# Patient Record
Sex: Male | Born: 1935 | Race: White | Hispanic: No
Health system: Southern US, Community
[De-identification: ages and names within clinical notes are randomized; demographics above are authoritative.]

---

## 1997-12-11 ENCOUNTER — Inpatient Hospital Stay (HOSPITAL_COMMUNITY): Admission: AD | Admit: 1997-12-11 | Discharge: 1997-12-13 | Payer: Self-pay | Admitting: Cardiology

## 1999-03-04 ENCOUNTER — Ambulatory Visit (HOSPITAL_COMMUNITY): Admission: RE | Admit: 1999-03-04 | Discharge: 1999-03-04 | Payer: Self-pay | Admitting: Cardiology

## 1999-10-12 ENCOUNTER — Encounter: Admission: RE | Admit: 1999-10-12 | Discharge: 2000-01-10 | Payer: Self-pay | Admitting: Internal Medicine

## 2000-01-10 ENCOUNTER — Encounter: Admission: RE | Admit: 2000-01-10 | Discharge: 2000-04-09 | Payer: Self-pay | Admitting: Orthopedic Surgery

## 2000-05-02 ENCOUNTER — Encounter: Admission: RE | Admit: 2000-05-02 | Discharge: 2000-07-31 | Payer: Self-pay | Admitting: Orthopedic Surgery

## 2000-08-22 ENCOUNTER — Encounter: Admission: RE | Admit: 2000-08-22 | Discharge: 2000-08-28 | Payer: Self-pay | Admitting: Orthopedic Surgery

## 2000-11-28 ENCOUNTER — Encounter: Admission: RE | Admit: 2000-11-28 | Discharge: 2000-12-04 | Payer: Self-pay | Admitting: Orthopedic Surgery

## 2002-08-30 ENCOUNTER — Encounter: Payer: Self-pay | Admitting: Cardiology

## 2002-08-30 ENCOUNTER — Inpatient Hospital Stay (HOSPITAL_COMMUNITY): Admission: AD | Admit: 2002-08-30 | Discharge: 2002-09-05 | Payer: Self-pay | Admitting: Cardiology

## 2002-09-01 ENCOUNTER — Encounter: Payer: Self-pay | Admitting: Cardiology

## 2002-09-03 ENCOUNTER — Encounter: Payer: Self-pay | Admitting: Cardiology

## 2005-04-19 ENCOUNTER — Emergency Department (HOSPITAL_COMMUNITY): Admission: EM | Admit: 2005-04-19 | Discharge: 2005-04-19 | Payer: Self-pay | Admitting: Emergency Medicine

## 2006-01-24 ENCOUNTER — Inpatient Hospital Stay (HOSPITAL_COMMUNITY): Admission: EM | Admit: 2006-01-24 | Discharge: 2006-02-03 | Payer: Self-pay | Admitting: Emergency Medicine

## 2007-02-26 ENCOUNTER — Encounter (HOSPITAL_BASED_OUTPATIENT_CLINIC_OR_DEPARTMENT_OTHER): Admission: RE | Admit: 2007-02-26 | Discharge: 2007-05-27 | Payer: Self-pay | Admitting: Surgery

## 2007-03-05 ENCOUNTER — Ambulatory Visit: Payer: Self-pay | Admitting: Surgery

## 2007-03-21 ENCOUNTER — Ambulatory Visit: Payer: Self-pay | Admitting: Vascular Surgery

## 2007-03-22 ENCOUNTER — Observation Stay (HOSPITAL_COMMUNITY): Admission: RE | Admit: 2007-03-22 | Discharge: 2007-03-22 | Payer: Self-pay | Admitting: Surgery

## 2007-03-22 ENCOUNTER — Ambulatory Visit: Payer: Self-pay | Admitting: Surgery

## 2007-04-18 ENCOUNTER — Inpatient Hospital Stay (HOSPITAL_COMMUNITY): Admission: EM | Admit: 2007-04-18 | Discharge: 2007-04-30 | Payer: Self-pay | Admitting: Emergency Medicine

## 2007-05-16 ENCOUNTER — Ambulatory Visit: Payer: Self-pay | Admitting: Vascular Surgery

## 2008-10-02 IMAGING — CT CT HEAD W/O CM
1 series · 16 of 30 positions shown, 20 images · IV contrast (agent unspecified)
Comparison: 02/02/07.

CLINICAL DATA: Diabetes and toe necrosis. 
HEAD CT WITHOUT CONTRAST:
TECHNIQUE: Contiguous axial images were obtained from the base of the skull through the vertex according to standard protocol without contrast.

[Series 2: head_seq 4.5 h37s st · axial · 0.43mm/px · z∈[+1241,+1389]mm · 16 of 36 slices shown, 20 images]
[im 2/36  brain]
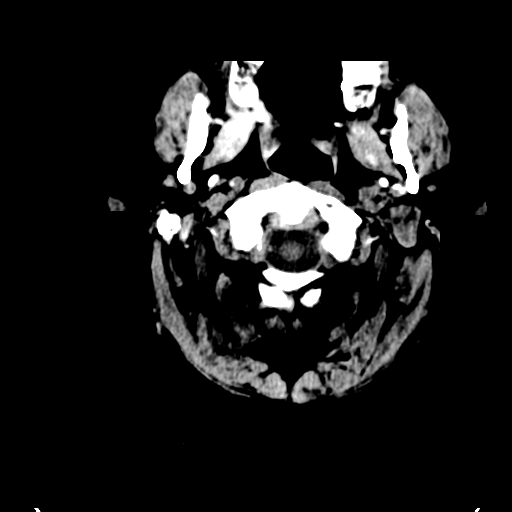
[im 2/36  bone]
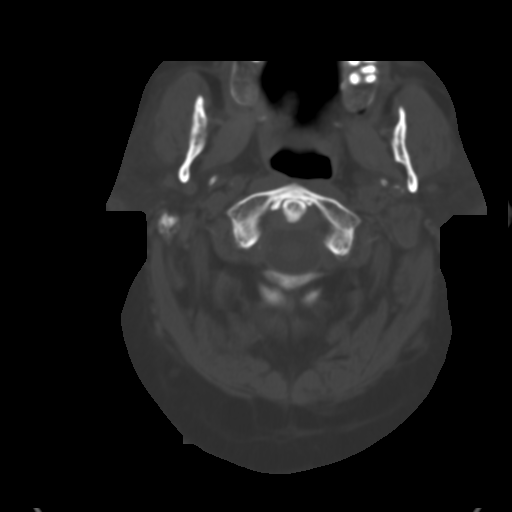
[im 4/36  brain]
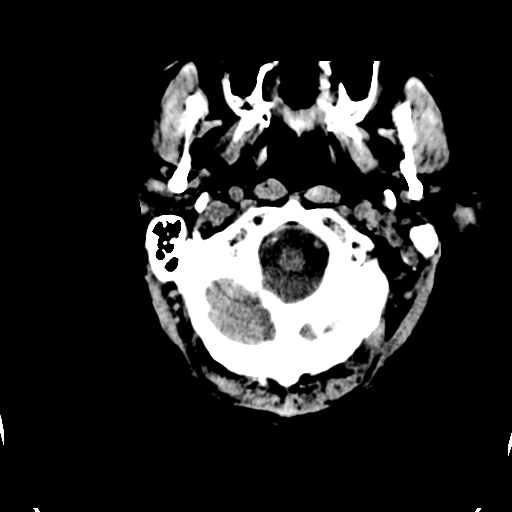
[im 7/36  brain]
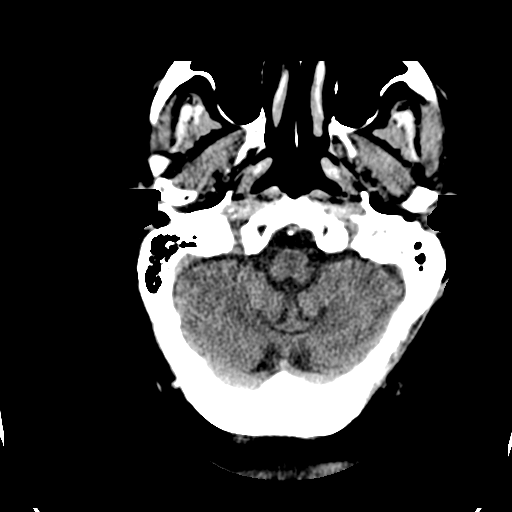
[im 9/36  brain]
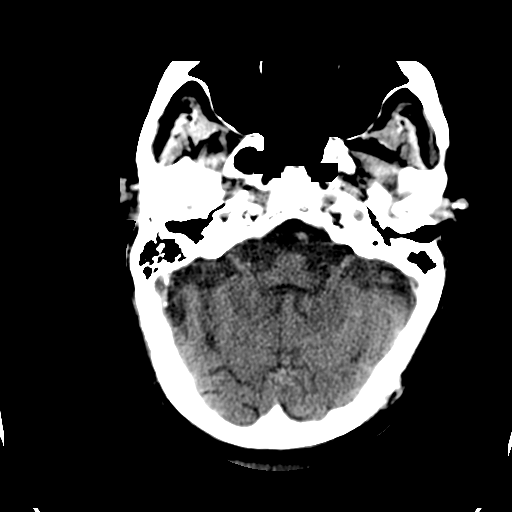
[im 10/36  brain]
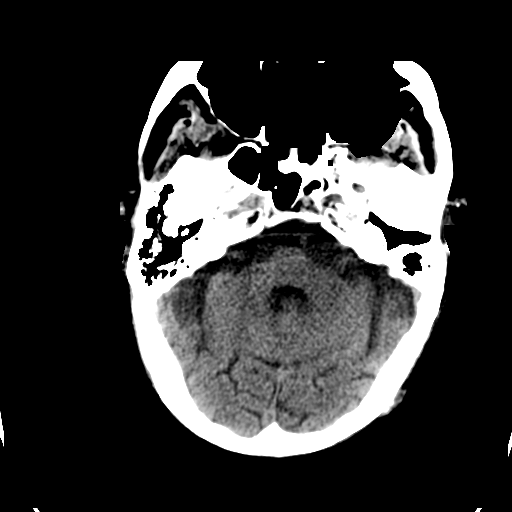
[im 10/36  bone]
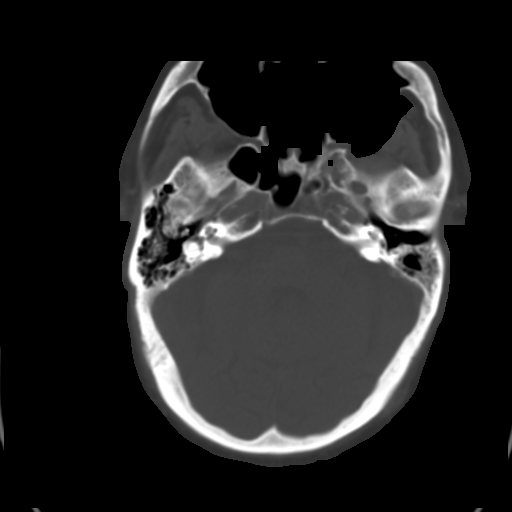
[im 13/36  brain]
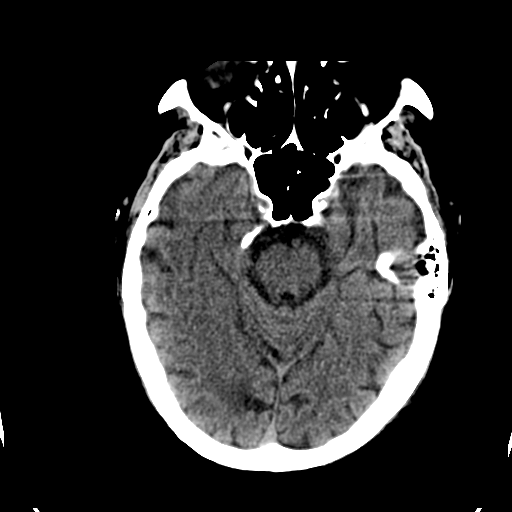
[im 15/36  brain]
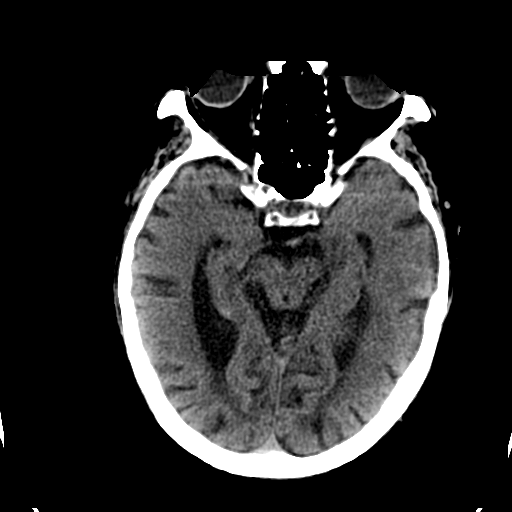
[im 17/36  brain]
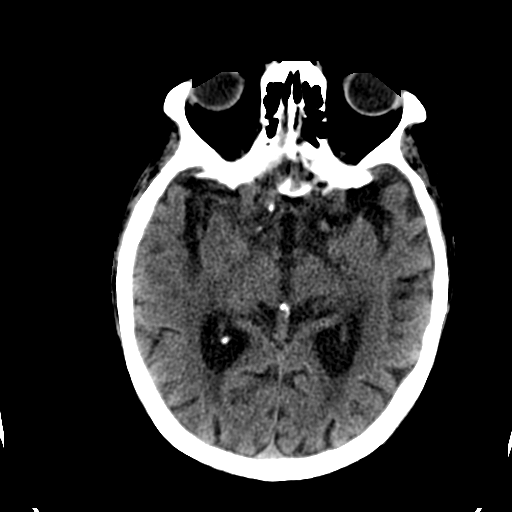
[im 19/36  brain]
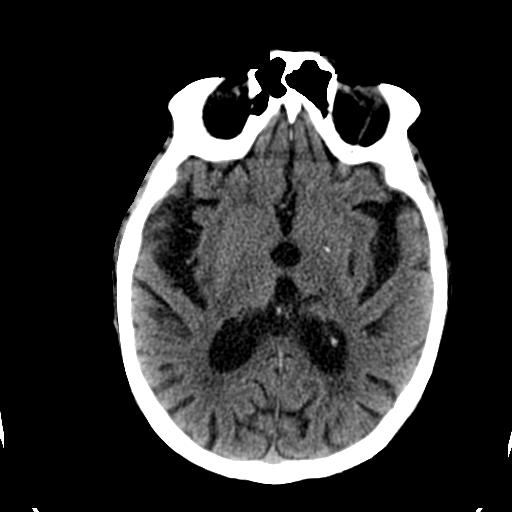
[im 19/36  bone]
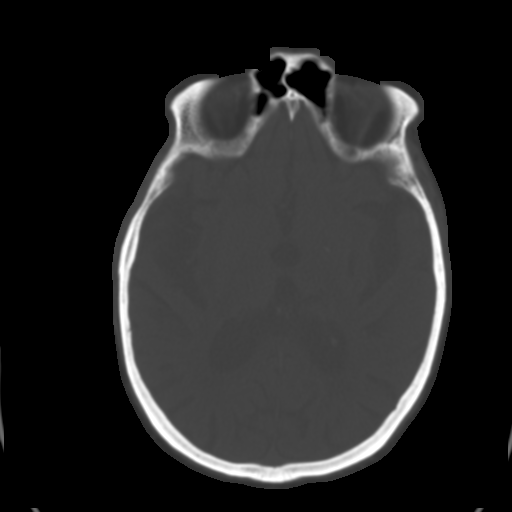
[im 21/36  brain]
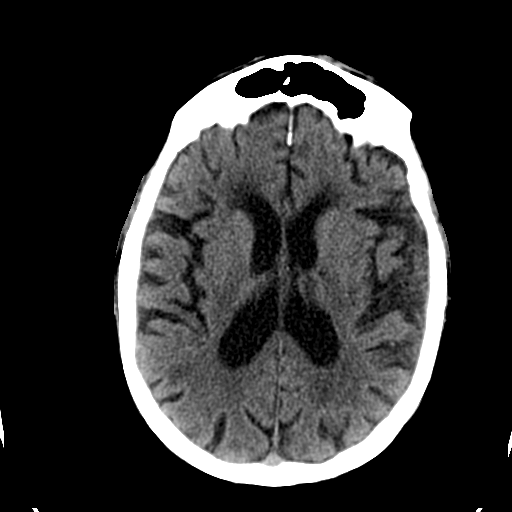
[im 23/36  brain]
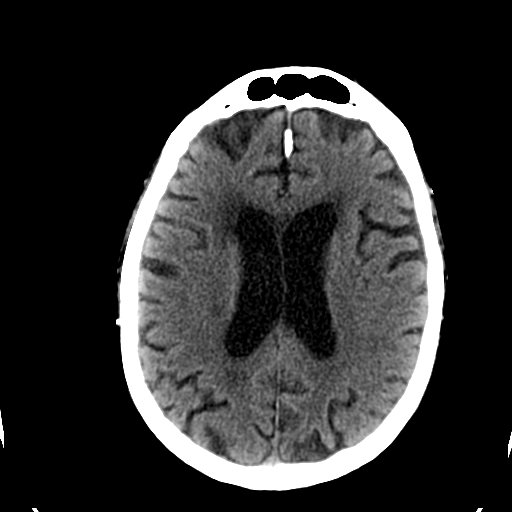
[im 26/36  brain]
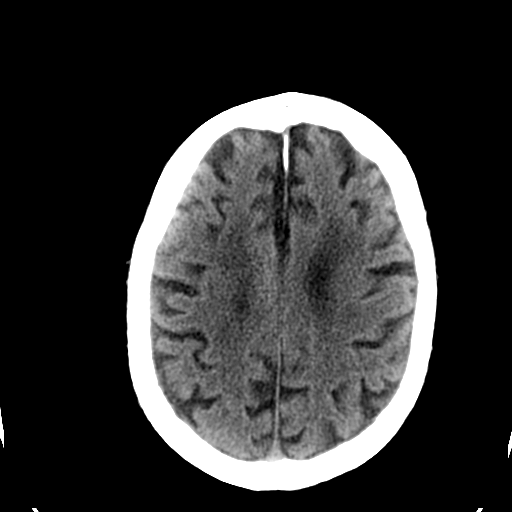
[im 27/36  brain]
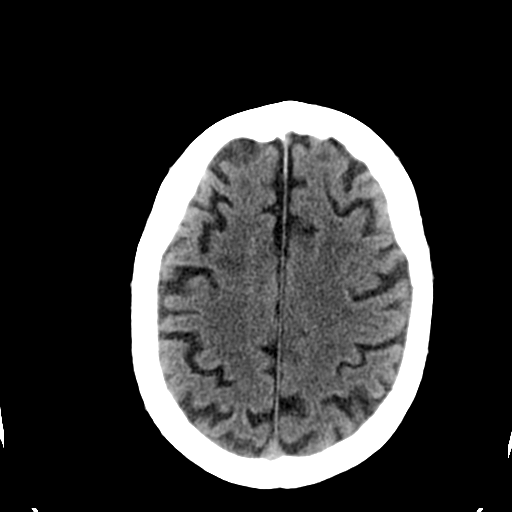
[im 27/36  bone]
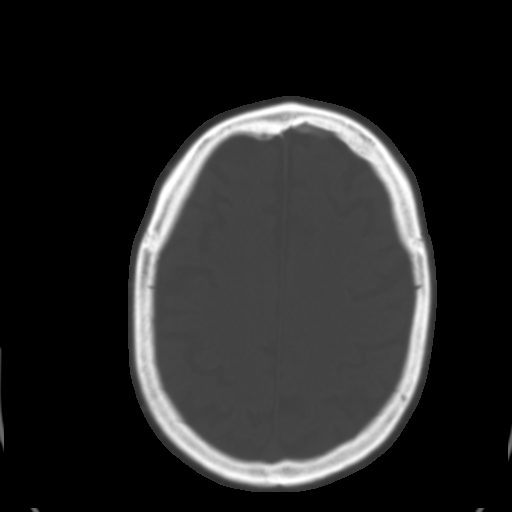
[im 29/36  brain]
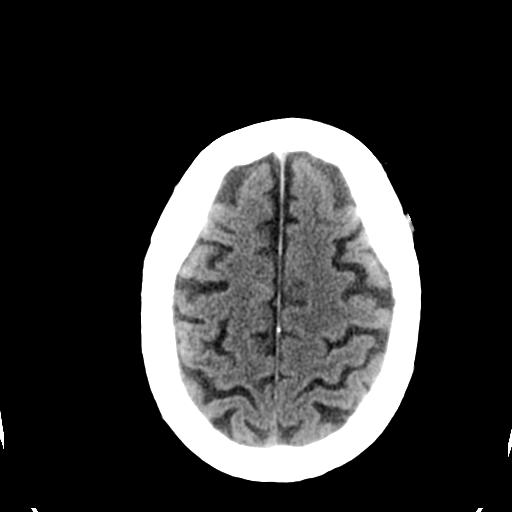
[im 32/36  brain]
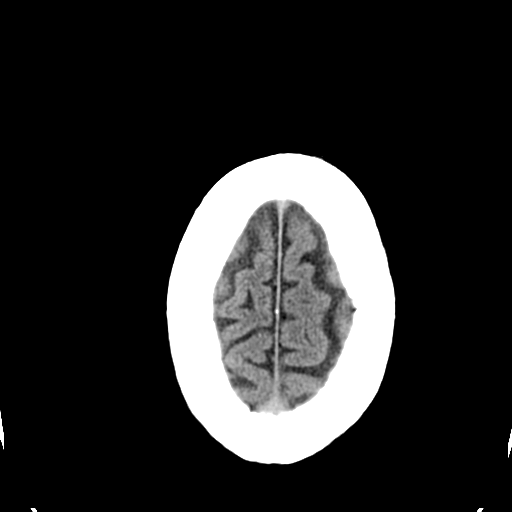
[im 34/36  brain]
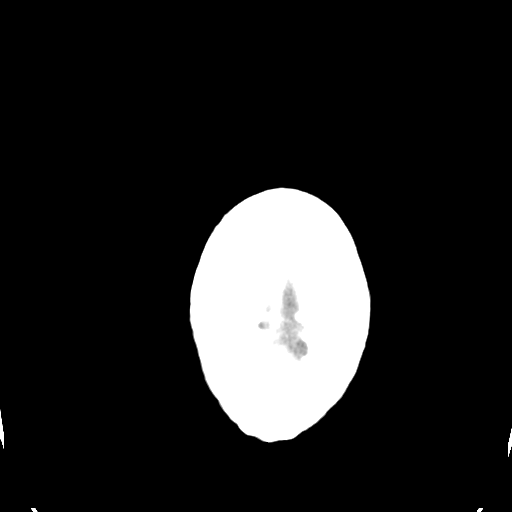

[16 of 30 positions shown; findings below may reference images not displayed]

FINDINGS: Marked cerebral and cerebellar atrophy.  Mild intracranial atherosclerosis.  Old encephalomalacia in the region of the left putamen, consistent with previously seen infarct.  Chronic ischemic periventricular white matter disease is present.  No acute intracranial abnormalities are present.  No hydrocephalus, mass, midline shift, hemorrhage, or territorial ischemic event.  If high suspicion consider MRI. 
Paranasal sinuses demonstrate a left mastoid effusion inferiorly.  Recommend clinical correlation for symptoms of mastoiditis.
IMPRESSION: 1.  No acute intracranial abnormality. 
2.  Chronic white matter disease and atrophy.
3.  Left mastoid effusion.  Recommend clinical correlation for symptoms of mastoiditis.

## 2008-10-02 IMAGING — CR DG CHEST 1V PORT
1 series · 1 of 1 positions shown · non-contrast
Comparison: 03/22/07.

CLINICAL DATA: Diabetes.  Necrosis.  
PORTABLE SEMIUPRIGHT AP CHEST - 1 VIEW 04/18/07:

[view not recorded]
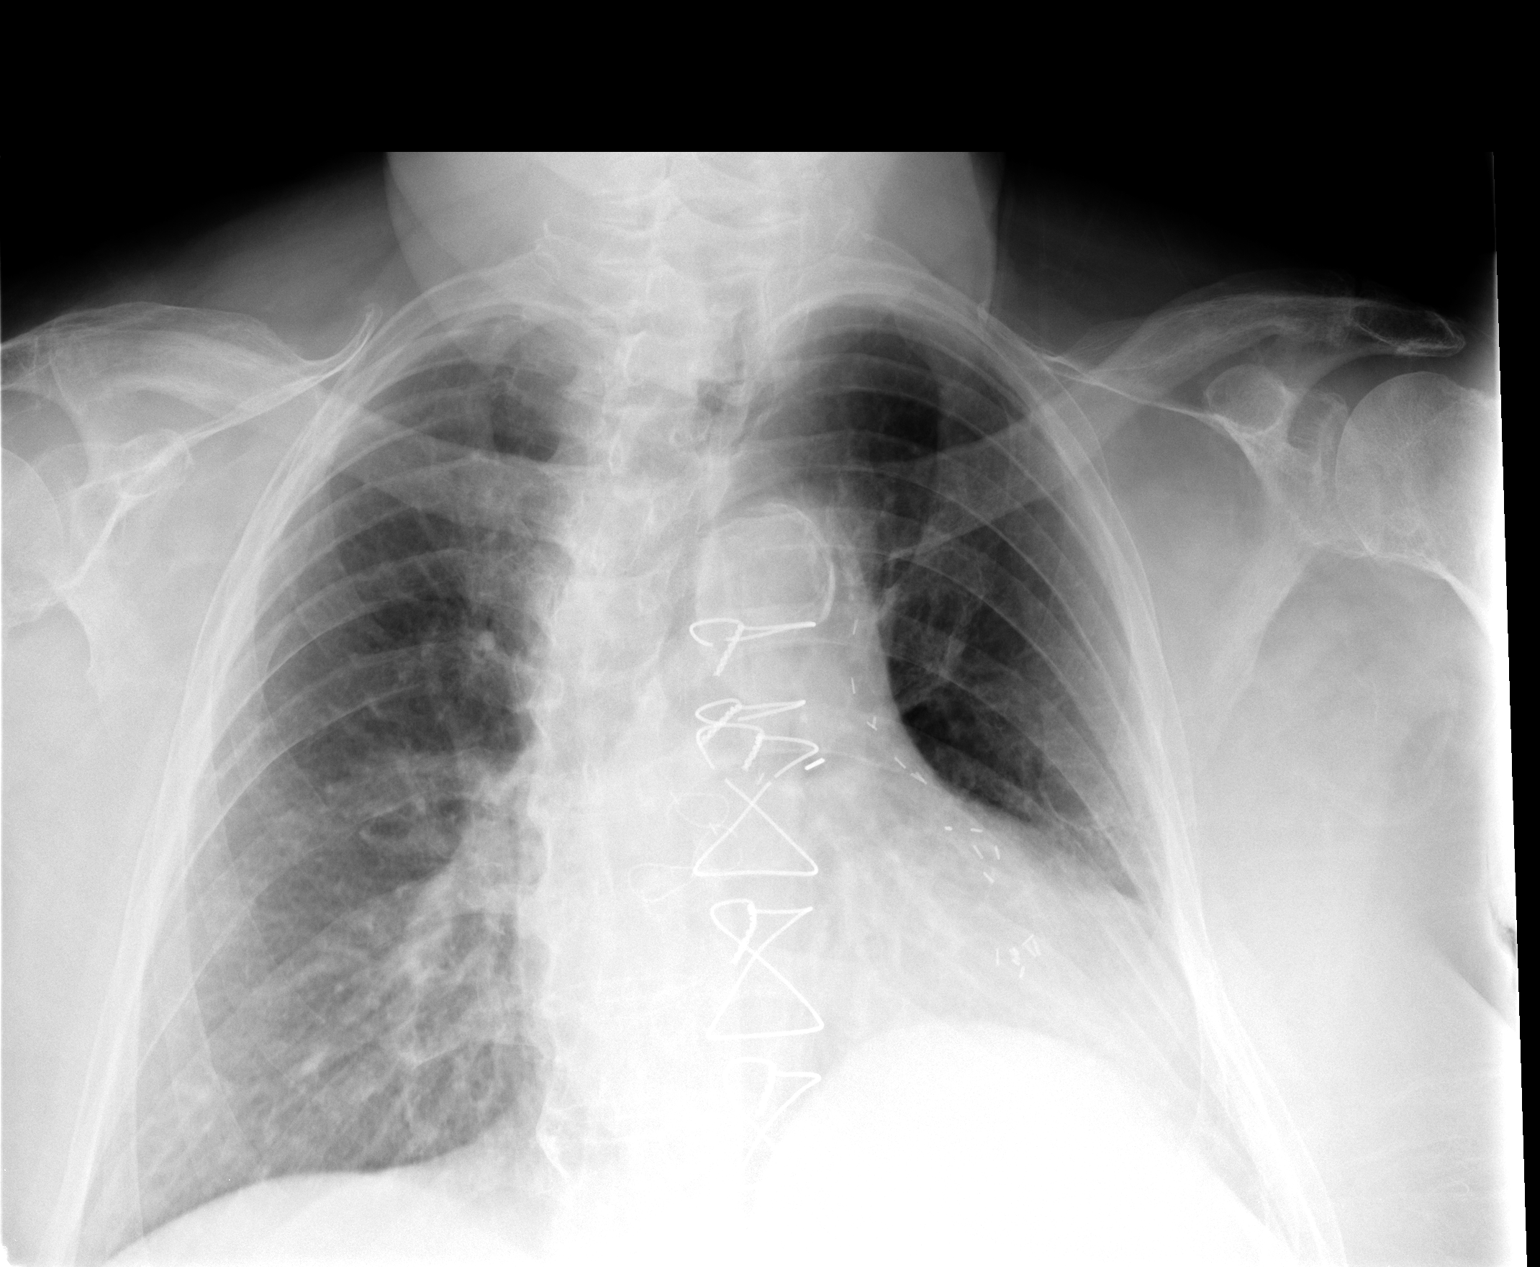

[1 of 1 positions shown; findings below may reference images not displayed]

FINDINGS: Median sternotomy/CABG.  Chronic elevation of the left hemidiaphragm.  No active cardiopulmonary disease.  Right costophrenic angle excluded from view.  Mild thoracic scoliosis.  Degenerative changes of the shoulders bilaterally.
IMPRESSION: Cardiomegaly without evidence of failure.

## 2008-10-07 IMAGING — CR DG CHEST 1V PORT
1 series · 1 of 1 positions shown · non-contrast
Comparison: 04/18/07.

CLINICAL DATA: Shortness of breath, renal failure, hypokalemia.
 PORTABLE CHEST - 1 VIEW - 04/23/07:

[view not recorded]
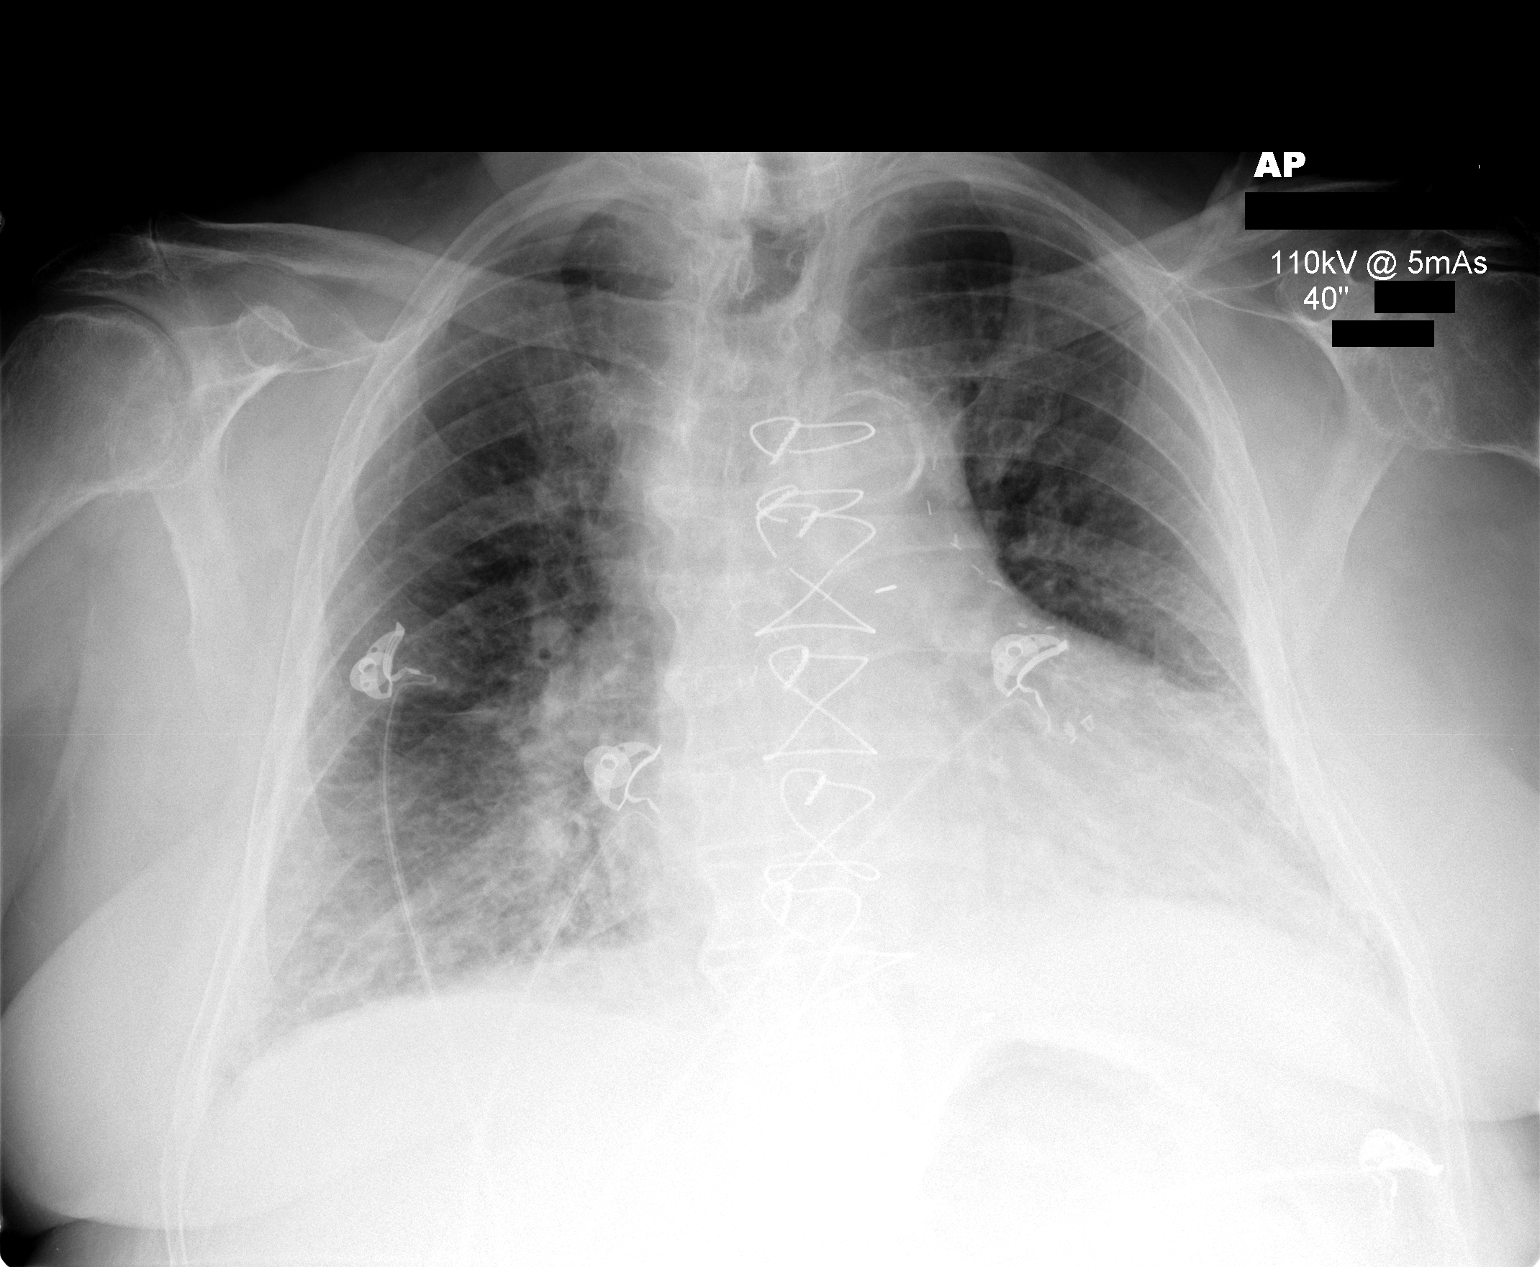

[1 of 1 positions shown; findings below may reference images not displayed]

FINDINGS: Cardiomegaly is noted with stable discontinuity of upper sternal wires.  There has been interval development of interstitial pulmonary opacities with Kerley B lines and trace bilateral effusions.  Bilateral shoulder degenerative change is identified.
IMPRESSION: Interval development of interstitial edema with cardiomegaly.
 Trace bilateral effusions.

## 2010-11-30 NOTE — Discharge Summary (Signed)
[name], [name]             ACCOUNT NO.:  [id number]   MEDICAL RECORD NO.:  [id number]          PATIENT TYPE:  INP   LOCATION:  1404                         FACILITY:  [facility]   PHYSICIAN:  Elliot Cousin, M.D.    DATE OF BIRTH:  [birth year]   DATE OF ADMISSION:  04/18/2007  DATE OF DISCHARGE:  04/28/2007                               DISCHARGE SUMMARY   ADDENDUM:  PLEASE SEE THE PREVIOUS DISCHARGE SUMMARY DICTATED BY DR. Michaelyn Barter.  THIS IS AN ADDENDUM.   HOSPITAL COURSE AND FINAL DISCHARGE DIAGNOSES:  1. URINARY TRACT INFECTION SECONDARY TO ENTEROCOCCUS.  The patient      continued to improve, with regards to the urinary tract infection.      As previously dictated by Dr. Roxan Hockey, the patient completed a      total of 5 days of antibiotic treatment with Cipro.  However, when      the urine culture grew out enterococcus susceptible to      nitrofurantoin, the antibiotic treatment was changed to it.  The      patient will complete four additional days on nitrofurantoin as of      tomorrow.  He will need an additional three more days of antibiotic      treatment.  The patient's Foley catheter has been discontinued      today.  We will watch for urinary retention.  2. ACUTE DECOMPENSATED CONGESTIVE HEART FAILURE.  The patient      developed shortness of breath and mild hypoxia several days ago.      His Lasix had been held initially because of dehydration.  In      addition, the patient was treated with IV fluids because of the      dehydration and acute renal insufficiency.  However, on October      7th, a BNP was ordered as well as a chest x-ray.  The BNP was      elevated at 1,310.  His chest x-ray at that time revealed interval      development of interstitial edema with cardiomegaly and a trace of      bilateral effusions.  The patient's IV fluids were then saline      locked.  He was started on intravenous Lasix 80 mg b.i.d.  Over the      course of the past few  days, the patient has diuresed well.  His      BNP is now 526.  His chest x-ray as of today, April 27, 2007,      reveals interval improvement in interstitial edema.  The patient's      Lasix will be transitioned to oral tomorrow.  Of note, a 2-D      echocardiogram was not performed during this hospitalization.      However, in 2004, his ejection fraction was estimated to be a 25%      to 35%.  3. HYPERTENSION.  The patient's blood pressure has been relatively      well-controlled on Lopressor and Altace.  4. INSULIN REQUIRING TYPE 2 DIABETES MELLITUS.  The patient's  capillary blood glucose has been relatively uncontrolled over the      past 2 days.  When his home medications were reviewed, it was      realized that he was getting only half of his usual dose of Lantus.      Of note, earlier on in the hospital course, he did experience      several episodes of hypoglycemia.  Over the past two days, the      Lantus has been titrated upward.  He is also being treated with      sliding scale NovoLog q.a.c. and q.h.s.  5. EPISODIC ALTERED MENTAL STATUS.  The patient appears to have      intermittent lethargy and mental status changes.  The exact      etiology of these changes is unclear.  His infection is being      adequately treated.  He is not febrile.  As indicated during the      previous discharge summary, the CT scan of his head on April 18, 2007 revealed marked cerebral and cerebellar atrophy, as well as      chronic white matter disease.  The CT also revealed old      encephalomalacia in the region of the left putamen, consistent with      a previous infarct.  It is possible that the patient may have      intermittent TIAs.  It is also possible that the patient may have      medication side effects, particularly from Cymbalta.  The patient      is 77 years old and appears to be on a moderately, relatively high      dose of Cymbalta.  Cymbalta can cause confusion and  lethargy.      Therefore, the dose of Cymbalta is being titrated down to 30 mg      daily.  6. DNR/NO CODE BLUE STATUS.   DISCHARGE MEDICATIONS:  1. Nitrofurantoin 50 mg q.i.d. for an additional 3 days.  2. Ascorbic acid 500 mg b.i.d.  3. Lasix 80 mg b.i.d.  4. Aspirin 81 mg daily.  5. Cymbalta 30 mg daily.  6. Sliding scale NovoLog q.a.c. and q.h.s.  7. Lantus 30 units subcu q.h.s. and 40 units subcu q.a.m.  8. Lopressor 75 mg b.i.d.  9. Multivitamin once daily.  10.Protonix 40 mg daily.  11.Potassium chloride 28 mEq b.i.d.  12.Altace 2.5 mg b.i.d.  (This medication can be titrated back up to      10 mg b.i.d. as needed).  13.Zocor 20 mg q.h.s.  14.Zinc sulfate 220 mg b.i.d.  15.Imdur 60 mg daily  16.Tylenol 650 mg q.4 h. p.r.n.  17.Senokot-S 2 tablets q.h.s.   DISCHARGE DISPOSITION:  The patient is currently approaching medical  stability.  The plan is to discharge him back to the skilled nursing  facility on tomorrow, April 28, 2007.  The patient's Foley catheter  has been discontinued, and we are currently monitoring his urine output.      Elliot Cousin, M.D.  Electronically Signed     DF/MEDQ  D:  04/27/2007  T:  04/28/2007  Job:  [id number]

## 2010-11-30 NOTE — Assessment & Plan Note (Signed)
[facility]   NAME:  [name], [name]             ACCOUNT NO.:  [id number]   MEDICAL RECORD NO.:  [id number]      DATE OF BIRTH:  20-Nov-1917   PHYSICIAN:  Maxwell Caul, M.D.      VISIT DATE:                                   OFFICE VISIT   This is a new patient assessment.   [name] was sent here today to look at an ulcer over the right  calcaneus and also a blackened area over the dorsal aspect of his right  first toe, just proximal to the nailbed.  The history here is that he  has had a refractory ulcer on the right calcaneus for several months.  He has a gentleman who has a history of ischemic heart disease status  post CABG in 1994 and 1999.  He has a history of intracranial  hemorrhage, acute MI, hypertension and ischemic cardiomyopathy.  He has  been treated in the nursing home for a quarter- sized ulcer on his right  calcaneus with various agents, including an Accuzyme over the last  several weeks.  He had arterial Dopplers done in the nursing home, which  suggested monophasic waves in the femoral area, although they could not  obtain any Dopplered pulse in his feet; therefore, no ABIs were done.  He also apparently traumatized his first toe 4 to 6 weeks ago.  I am  uncertain whether this really left an ulcer or not; however, there is a  blackened area on a dorsal surface of his first toe up.   PAST MEDICAL HISTORY:  1. Is extensive in this gentleman, includes ischemic heart disease      status post CABG as noted above.  2. Hypertension.  3. Type 2 diabetes with diabetic neuropathy.  4. Ischemic cardiomyopathy with congestive heart failure.  5. History of GI bleed.  6. History of prostate cancer.  7. History of an intracerebral hemorrhage, which I think is the noted      reason for his nursing home admission.  8. Gastroesophageal reflux disease.  9. Hyperlipidemia.  10.Echocardiogram in 2005 noting an ejection fraction of 35% to 40%.   CT  scan on February 02, 2007 noted left brain hemorrhagic infarct.   MEDICATIONS:  Please refer to nursing home list provided in his chart.   SOCIAL HISTORY:  Socially as mentioned, he is a patient who is at [facility], [place], nursing home under the care of Dr. Leanord Hawking and  [facility].   EXAMINATION:  GENERAL:  Somewhat frail-looking 77 year old man.  Nevertheless, he is alert and able to discuss his situation.  He is  accompanied by his daughter.  RESPIRATORY:  Routine dictation.  CARDIAC:  His CABG scar is noted.  There is no S4, no murmurs.  No  increase in jugular venous pressure.  EXTREMITIES:  Circulation.  I could not feel peripheral pulses anywhere,  even in the femoral.  With a Doppler, we were unable to hear any pulses  in his feet bilaterally.  The right foot looks ischemic to me, cold,  especially in the forefoot.  The patient complains of pain in the foot,  which sometimes wakes him up at night.  I would not be at all surprised,  if  this is a claudication pain at rest.   Wound exam limited to his a right foot.  He had a small ulcer at on the  right heel measuring 1.8 x 1.5 x 0.1.  This underwent a full-thickness  debridement with a #15 blade, __________ for anesthesia.  Hemostasis was  with direct pressure.  Next over the right great toe, was a blackened  area; however, had there was no evidence of infection here.  This looked  most like there was a possibility of a prior wound with perhaps some  subungual hemorrhage.  I do not think this is ischemic gangrene, at  least not currently.   IMPRESSION:  Ulcer on the right heel.  This is probably a pressure  ulcer, however, complicated by severe peripheral vascular disease.  The  traumatize area through his right first toe is also noted, although I do  not think this is ischemic at this point.  There was no open wound here.  We did pare some callus from around the wound of the first toe.  However, we did not  disturb the blackened area.  The area on the right  calcaneus was full-thickness debrided, as noted above.  I would suggest  continued Accuzyme to this wound.  We put the whole her foot in a PRAFO  boot for protection.  I have asked for a vascular surgery consult,  within the next 10 days.  He would be a high-risk surgical candidate.  Nevertheless, if we are to take this further, I think he will need an  arteriogram.  I do not think that we are going to have healing without  more vascular supply, furthermore, we will likely have more areas of  breakdown.  We will see him again after the vascular consult.           ______________________________  Maxwell Caul, M.D.     MGR/MEDQ  D:  02/28/2007  T:  03/01/2007  Job:  [id number]

## 2010-11-30 NOTE — Op Note (Signed)
NAMESHEPPARD, LUCKENBACH             ACCOUNT NO.:  [id number]   MEDICAL RECORD NO.:  [id number]          PATIENT TYPE:  INP   LOCATION:  2852                         FACILITY:  [facility]   PHYSICIAN:  Juleen China IV, MDDATE OF BIRTH:  [birth year]   DATE OF PROCEDURE:  03/22/2007  DATE OF DISCHARGE:  03/22/2007                               OPERATIVE REPORT   REASON FOR VISIT:  Right leg rest pain with ulceration.   POSTOPERATIVE DIAGNOSIS:  Right leg rest pain with ulceration.   PROCEDURES PERFORMED:  1. Aortogram.  2. Second order catheterization (right external iliac artery).  3. Right lower extremity runoff.   INDICATIONS FOR PROCEDURE:  This is an 77 year old gentleman with right  lower extremity claudication and rest pain who comes with a baseline  creatinine of 1.5.  The risks and benefits of performing a percutaneous  diagnostic procedure with the possibility of an intervention were  discussed with the patient.  Informed consent was signed.   PROCEDURE:  The patient was identified in the holding area and taken to  room 8.  He was placed supine on the table.  Bilateral groins were  prepped and draped in standard, sterile fashion.  Lidocaine 1% was used  for local anesthesia.  The left groin was evaluated with ultrasound, the  artery was found to be patent.  Lidocaine 1% was used for local  anesthesia.  Using ultrasound the left common femoral artery was  accessed with the Micropuncture needle.  An 0.018 wire was then advanced  in a retrograde fashion to the abdominal aorta under fluoroscopic  visualization.  Next, a Micropuncture sheath was placed.  The 0.018 wire  was removed and the introducer to the sheath was removed.  An 0.035  Wholey wire was advanced into the abdominal aorta under fluoroscopic  visualization.  Micropuncture sheath was removed and a 5-French sheath  was then placed.  Over the wire, Omni-Flush catheter was placed to the  level of L1 and abdominal  aortogram was obtained.  Next, using the Omni-  Flush catheter and a Wholey wire, the aortic bifurcation was crossed.  The catheter was placed in the right external iliac artery and right  lower extremity runoff was obtained.   FINDINGS:  Aortogram:  The visualized portion of the suprarenal  abdominal aorta showed minimal disease.  Renal arteries are patent  bilaterally without evidence of stenosis.  The infrarenal abdominal  aorta shows mild disease with no areas of focal stenosis or occlusion.  Bilateral common and external and internal iliac arteries are patent.   Right lower extremity:  The right external iliac artery is widely  patent.  The right profunda femoral artery is patent.  It is diseased  distally.  The right superficial femoral artery occludes just distal to  its origin.  Collateral vessels are the only vessels visualized from the  mid thigh to the mid calf.  There is dimunitive reconstitution of a  perineal artery in the mid leg, however, it terminates well above the  ankle and is diseased throughout.  There are no named blood vessels  crossing the foot.  After the above images were obtained, decision was made to terminate the  procedure.  Catheters and wires were removed.  The patient was taken to  the holding area for sheath pull.   IMPRESSION:  Severe peripheral vascular disease beginning at the level  of the inguinal ligament.  Patient is not a candidate for percutaneous  intervention.      Jorge Ny, MD  Electronically Signed     VWB/MEDQ  D:  03/22/2007  T:  03/22/2007  Job:  [id number]

## 2010-11-30 NOTE — Assessment & Plan Note (Signed)
OFFICE VISIT   [name], [name]  DOB:  05-09-1918                                       03/21/2007  TDVVO#:[id number]   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  He is scheduled for an  arteriogram on March 22, 2007 by Dr. Myra Gianotti, possibly angioplasty  and stenting.  Full H and P was dictated on the [facility].  Dictation  on the [facility] was [id number].   Janetta Hora. Fields, MD  Electronically Signed   CEF/MEDQ  D:  03/21/2007  T:  03/22/2007  Job:  [id number]

## 2010-11-30 NOTE — Discharge Summary (Signed)
[name], [name]             ACCOUNT NO.:  [id number]   MEDICAL RECORD NO.:  [id number]          PATIENT TYPE:  INP   LOCATION:  1404                         [facility]ACILITY:  [facility]   PHYSICIAN:  Elliot Cousin, M.D.    DATE OF BIRTH:  [birth year]   DATE OF ADMISSION:  04/18/2007  DATE OF DISCHARGE:  04/30/2007                               DISCHARGE SUMMARY   ADDENDUM:   HOSPITAL COURSE:  The patient's discharge was delayed because there was  no skilled nursing facility bed available.  Over the past 2 days, he has  remained stable and clinically improved.  He continues to be  intermittently lethargic; however, he becomes more alert when aroused  and when addressed.  During the past 24-48 hours, the patient has been  sitting up in the chair.  He is usually nodding off; however, when he is  addressed, he awakens and appears to answer the questions appropriately.  As indicated previously, the dose of Cymbalta was decreased to 30 mg  daily.   All of the other medications as previously dictated stay the same.  Two  additional medications have been ordered; the first is oxygen 2 liters  per minute as needed for oxygen saturations less than 90% on room air;  and the second is Xopenex nebulizer 0.63 mg every 4 hours p.r.n.   HOSPITAL DISCHARGE DISPOSITION:  The patient will be discharged to a  skilled nursing facility today.      Elliot Cousin, M.D.  Electronically Signed     DF/MEDQ  D:  04/30/2007  T:  04/30/2007  Job:  [id number]

## 2010-11-30 NOTE — H&P (Signed)
NAMEDOMANIC, MATUSEK NO.:  [id number]   MEDICAL RECORD NO.:  [id number]          PATIENT TYPE:  REC   LOCATION:  FOOT                         FACILITY:  [facility]   PHYSICIAN:  Janetta Hora. Fields, MD  DATE OF BIRTH:  [birth year]   DATE OF ADMISSION:  02/26/2007  DATE OF DISCHARGE:                              HISTORY & PHYSICAL   HISTORY OF PRESENT ILLNESS:  Patient is an 77 year old male who is  currently a resident of [facility].  Recently, he had an ingrown  toenail on the right first toe.  This is approximately two months ago.  The right first toe has now become black in color.  It is painful.  He  has rest pain in the right foot.  Prior to this, he had claudication-  type symptoms at 30-40 feet.  Right leg was worse than left.   His atherosclerotic risk factors include diabetes, hypertension,  coronary artery disease with previous myocardial infarction, elevated  cholesterol, and congestive heart failure.   PAST SURGICAL HISTORY:  He had coronary artery bypass grafting in 1997  with removal of the right greater saphenous vein.  He has also  previously had cholecystectomy.   PAST MEDICAL HISTORY:  Otherwise remarkable for neuropathy, decreased  visual acuity, mild dementia, and multiple prior small strokes,  resulting in primarily right-sided weakness.   FAMILY HISTORY:  Unremarkable.   SOCIAL HISTORY:  He is single.  Has four children.  He is a nonsmoker  and nonconsumer of alcohol.   REVIEW OF SYSTEMS:  He has had some weight gain recently.  He has a past  history of some chest pain, shortness of breath from lying flat,  palpitations, and shortness of breath.  He denies the use of home  oxygen, asthma, or wheezing.  He has no history of GI bleeding.  He does  have a history of some renal insufficiency.  He does not note a recent  creatinine.  He denies a prior history of DVT or syncopal episodes.  He  does have multiple joint arthritis and muscle pain.   He has no history  of clotting disorders.   MEDICATIONS:  1. Lantus insulin 30 units in the evening.  2. Sliding-scale insulin at meals.  3. Lantus insulin 40 units in the morning.  4. Aspirin 81 mg once daily.  5. Lasix 80 mg twice daily.  6. Simvastatin 20 mg in the evening.  7. Potassium chloride 20 mEq once daily.  8. Multivitamins once daily with vitamin C 500 mg twice daily.  9. Avelox 400 mg once daily for a 14-day course, ending on September      13th.  10.Cymbalta 90 mg once daily.  11.Imdur 60 mg once daily.  12.Altace 10 mg once daily.  13.Lopressor 75 mg b.i.d.  14.Eucerin cream, both legs, once daily.  15.Nitroglycerin p.r.n.  16.Vicodin p.r.n.   He is allergic to PENICILLIN, unknown reaction to this, but apparently,  according to his family, it was severe.   PHYSICAL EXAMINATION:  Blood pressure is 91/54.  Pulse is 78 and  regular.  HEENT:  Unremarkable.  He has 2+ carotid pulses without bruits.  CHEST:  Clear to auscultation.  CARDIAC:  Regular rate and rhythm without murmur, rub or gallop.  ABDOMEN:  Slightly obese.  Soft and nontender.  He has 2+ radial and 1+ femoral pulses bilaterally.  He has absent  popliteal and pedal pulses bilaterally.  He has a 3 x 3 cm ulceration on  the right heel which has black eschar covering it.  The right first toe  is gangrenous.  The left foot is pink, warm, and adequately perfused.  There is dry skin on both lower extremities.   He had ABIs performed on August 18th, which showed an ABI on the left of  0.32 and inaudible Doppler flow on the right side.  He had monophasic  Doppler flow in the common femoral arteries bilaterally, suggestive of  some component of aortoiliac disease.   ASSESSMENT:  Patient is an 77 year old male with gangrene and a  nonhealing wound at the right foot.  He probably has multilevel arterial  occlusive disease, including some component of iliac disease but  probably also a more severe component  of superficial femoral and tibial  artery occlusive disease.  We have scheduled him for an aortogram with  bilateral lower extremity run-off, possible angioplasty and stenting on  September 4th by my partner, Dr. Myra Gianotti.  If his creatinine is  elevated, we may need to consider hydration prior to this.  If his  creatinine is elevated to the point it would possibly tip him into  dialysis, we may need to reconsider things.  I explained to the family  today the risks, benefits, and possible complications of the procedure  in detail.  Unfortunately, he has had his reverse greater saphenous vein  removed.  Overall, he is not a very good candidate for a lower extremity  bypass procedure.  We will try to angioplasty and stent whatever may be  possible to try and get his foot to heal.      Janetta Hora. Fields, MD  Electronically Signed     CEF/MEDQ  D:  03/21/2007  T:  03/21/2007  Job:  [id number]   cc:   Maxwell Caul, M.D.  Cassell Clement, M.D.

## 2010-11-30 NOTE — H&P (Signed)
NAMEARRIE, Byrd NO.:  [id number]   MEDICAL RECORD NO.:  [id number]          PATIENT TYPE:  INP   LOCATION:  1404                         FACILITY:  [facility]   PHYSICIAN:  Della Goo, M.D. DATE OF BIRTH:  1918-07-11   DATE OF ADMISSION:  04/18/2007  DATE OF DISCHARGE:                              HISTORY & PHYSICAL   PRIMARY CARE PHYSICIAN:  [facility], Dr. [name].   CHIEF COMPLAINT:  Lethargy.   HISTORY OF PRESENT ILLNESS:  This is an 77 year old male who was sent  emergently to the ER secondary to lethargy over the past 2-3 days.  The  patient also had been having increased complaints of pain from his right  great toe area which has been gangrenous for the past 3 months and has  been evaluated.  The patient was evaluated in the emergency department  and had an orthopedic evaluation while in the emergency department and  was referred for admission secondary to abnormal lab findings of an  elevated potassium and increased BUN and creatinine.  The patient had  been undergoing wound care and evaluation by Orthopedics, and the right  great toe gangrene is currently being medically treated.   PAST MEDICAL HISTORY:  1. Ischemic cardiomyopathy.  2. Coronary artery disease.  3. Diabetes mellitus.  4. Hyperlipidemia.  5. Hypertension.  6. Neuropathy.  7. CVA with right-sided hemiparesis.  8. Gangrenous right great toe.  9. Dementia.  The patient also has a previous history of GI bleeding.   PAST SURGICAL HISTORY:  1. History of coronary artery bypass grafting.  2. Cholecystectomy.   MEDICATIONS:  1. Metoprolol 75 mg one p.o. b.i.d.  2. Simvastatin 20 mg one p.o. nightly.  3. Imdur 60 mg one p.o. daily.  4. Aspirin 81 mg one p.o. daily.  5. Zinc sulfate 220 mg one p.o. daily.  6. Vitamin C 500 mg one p.o. b.i.d.  7. Lasix 80 mg one p.o. b.i.d.  8. Lantus insulin 30 units subcu nightly, 40 units subcu q.a.m.  9. Potassium chloride  20 mEq one p.o. daily.  10.Cymbalta 90 mg one p.o. daily.  11.Multivitamin one p.o. daily.  12.Altace 10 mg p.o. b.i.d.   ALLERGIES:  PENICILLIN AND SULFA   SOCIAL HISTORY:  The patient is a nursing home resident at the [facility] facility.  He is a nonsmoker, nondrinker.   FAMILY HISTORY:  Noncontributory.   REVIEW OF SYSTEMS:  Pertinents are mentioned.   PHYSICAL EXAMINATION:  GENERAL:  This is an elderly, confused, lethargic  77 year old male in no acute distress.  VITAL SIGNS:  Temperature is 97.0, blood pressure 122/66, heart rate 73,  respirations 24, O2 saturations ranging from 95-99%.  HEENT: Examination normocephalic, atraumatic.  Pupils are equally round  reactive to light.  Extraocular muscles are intact.  Funduscopic are  benign.  Oropharynx is dry.  There is no exudate or erythema.  Dentition  is sparse and poor.  NECK:  Supple, full range of motion.  No thyromegaly, adenopathy,  jugular venous distention.  CARDIOVASCULAR:  Regular rate and rhythm.  No murmurs, gallops or rubs.  LUNGS:  Clear to auscultation bilaterally.  ABDOMEN:  Positive bowel sounds, soft, nontender, nondistended.  EXTREMITIES:  Without cyanosis, clubbing or edema.  NEUROLOGIC:  The patient is somnolent but arousable.  He is sluggish.  He is able to move all four extremities.  There is right-sided  hemiparesis.   LABORATORY STUDIES:  White blood cell count 15.3, hemoglobin 13.0,  hematocrit 38.6, platelets 329, neutrophils 76%, lymphocytes 15%.  Sodium 146, potassium 5.8, chloride 106, carbon dioxide 30, BUN 76,  creatinine 3.37 and glucose 191.  Urinalysis:  Moderate leukocyte  esterase.   ASSESSMENT:  An 77 year old male being admitted with:  1. Urinary tract infection/urosepsis.  2. Hyperkalemia.  3. Acute renal failure with chronic renal insufficiency.  4. Type 2 diabetes mellitus  5. Gangrenous right great toe.  6. Ischemic cardiomyopathy.   PLAN:  The patient will be admitted  to a telemetry area for cardiac  monitoring.  IV antibiotic therapy with Cipro has been ordered.  IV  fluids have also been ordered for rehydration therapy.  His electrolytes  will be monitored and replaced p.r.n.  Kayexalate therapy has had been  ordered.  A repeat potassium level will be checked in 6 hours after  Kayexalate therapy has been given.  His Lasix and Altace therapy has  been placed on hold.  The patient has already had an orthopedic  consultation.  The consult is on the chart.  Please refer to the  consultation for further information regarding the right great toe  gangrene.      Della Goo, M.D.  Electronically Signed     HJ/MEDQ  D:  04/19/2007  T:  04/20/2007  Job:  [id number]   cc:   Maxwell Caul, M.D.   Janetta Hora. Fields, MD  [address] [place], [place] [address]

## 2010-11-30 NOTE — Assessment & Plan Note (Signed)
[facility]   NAME:  Juan, Byrd NO.:  [id number]   MEDICAL RECORD NO.:  [id number]      DATE OF BIRTH:  20-Sep-1917   PHYSICIAN:  Maxwell Caul, M.D. VISIT DATE:  03/16/2007                                   OFFICE VISIT   [name] is a gentleman who is actually a patient in our practice at  [facility].  I saw him two weeks ago in the clinic here  and had requested to a vascular surgery consult re: likely severe  peripheral vascular disease with ischemic rest pain.  This appointment  was apparently left to the nursing home to follow-up with.  He did do  over for a arterial Doppler.  He had actually already had one of these,  was known to have severe monophasic waves.  This was again reiterated in  a Doppler report from vascular surgery.  The left tibial vessels were  barely pulsatile, right tibial vessels inaudible.   EXAMINATION:  The patient is afebrile with a temperature of 98.5.  He is  alert and does not appear to be systemically ill.  He has two areas of  ischemic ulceration on his right great toe measuring 2 x 2 x 0.5 and  also on his right heel measuring 2 x 2 x 0.3, both of these are  ischemic.  His toe is very cool as is his forefoot.  More worrisome to  me there was an area of superficial erythema on his forefoot which is  somewhat tender.  The possibility of cellulitis here is certainly  present.   WOUND CARE PLAN AND FOLLOW-UP:  Largely ischemic wounds on his right  great toe and right heel.  Unfortunately he has gone on to have a  Doppler study which he had already had.  I have made phone calls to our  service in the nursing home as well as vascular surgery.  We have set  him up for an appointment on Wednesday of next week.  I have also  contacted the nursing home and we will put him on antibiotics.  He is  allergic to PENICILLIN, therefore probably a quinolone would be the  treatment of choice.  I am  sorry that we did not convey more of an  urgency to this to the nursing home last time, although the tone of my  referral note suggested limb threatening ischemia.  I have not made firm  arrangements to see him back here.  We will follow in the nursing home  and be available after his vascular surgery consult.           ______________________________  Maxwell Caul, M.D.     MGR/MEDQ  D:  03/16/2007  T:  03/17/2007  Job:  [id number]

## 2010-11-30 NOTE — Assessment & Plan Note (Signed)
OFFICE VISIT   RISHON, THILGES  DOB:  July 15, 1918                                       05/16/2007  XLKGM#:[id number]   Patient returns for followup today.  He underwent right lower extremity  arteriogram by Dr. Myra Gianotti IV in September.  This showed severe arterial  occlusive disease with no options for reconstruction from a percutaneous  or an open operative standpoint.  He presents today with complaints of  pain in the right foot.   He has gangrene of the right first toe.  There is some surrounding  erythema up in the forefoot.  There is no obvious evidence of abscess.   I had a discussion with the daughter today that the best option for him  would be a right above-the-knee amputation.  She does not wish to put  him through an amputation if possible.  I believe the best other  secondary option is pain control.  I have prescribed Percocet for him  today.  She did ask about an antibiotic, and I do not believe it is  worthwhile to place him on prophylactic antibiotics, as an amputation  would actually be a better option long term.   They will call to schedule a right above-the-knee amputation if they  wish that in the future, otherwise they will follow up on an as-needed  basis.   Janetta Hora. Fields, MD  Electronically Signed   CEF/MEDQ  D:  05/16/2007  T:  05/17/2007  Job:  [id number]   cc:   Maxwell Caul, M.D.

## 2010-11-30 NOTE — Discharge Summary (Signed)
[name], [name] NO.:  [id number]   MEDICAL RECORD NO.:  [id number]          PATIENT TYPE:  INP   LOCATION:  1404                         FACILITY:  [facility]   PHYSICIAN:  Michaelyn Barter, M.D. DATE OF BIRTH:  September 15, 1917   DATE OF ADMISSION:  04/18/2007  DATE OF DISCHARGE:                               DISCHARGE SUMMARY   The patient's primary care doctor is [name] of [facility].   FINAL DIAGNOSES:  1. Urinary tract infection secondary to enterococcus.  2. Renal insufficiency.  3. Dehydration.  4. Elevated BNP.  5. Hypoglycemia.  6. Necrotic right great toe.  7. Failure to thrive.  8. Slight bump in cardiac markers.   PROCEDURES:  1. X-ray of the right foot completed April 18, 2007.  2. Portable chest x-ray completed on April 18, 2007 and April 23, 2007.  3. CT scan of the head without contrast completed April 18, 2007.   CONSULTATIONS:  Orthopedic surgery with Dr. Madelon Byrd.   HISTORY OF PRESENT ILLNESS:  [name] is an 77 year old gentleman who  was sent to the hospital secondary to developing lethargy other the two  to three days leading up to admission. It was recorded that the patient  also complained of some pain associated with his right great toe which  had been necrotic/gangrenous for approximately three months.   For the past medical history, please see the dictation dictated by Dr.  Della Byrd.   HOSPITAL COURSE:  1. Urinary tract infection. The patient had a urinalysis completed      which revealed moderate leukocytes as well as 7-10 WBCs. He was      started on ciprofloxacin. Cultures were obtained. The patient's      urine cultures were later reported several days later. The organism      was found to be resistant to levofloxacin; therefore, ciprofloxacin      has been discontinued, and nitrofurantoin has been initiated.  2. Renal insufficiency. There may have been a prerenal component to  this. The patient may have become dehydrated while at the facility.      At the time of his admission, the patient's BUN was noted to be 76.      His creatinine was 3.37. IV fluid hydration was provided to the      patient, and the patient's BUN and creatinine have significantly      improved. As of April 23, 2007, the patient's BUN is 17. His      creatinine is 1.11.  3. Dehydration. Again, this condition has been treated with IV fluid      hydration.  4. Elevated BNP. Over the past two days, the patient's breathing has      began to become noticeably labored. The patient has not complained      of any shortness of breath, but the patient does appear to have      some baseline mental deficiency which may be secondary to dementia.      His ability to communicate is somewhat limited, although he can at  times respond to simple questions. Chest x-ray done at the time of      admission revealed cardiomegaly without evidence of failure. Over      the past two days, as the patient's breathing has become more      labored, a repeat chest x-ray was done October 6. It revealed      interval development of interstitial edema with cardiomegaly. Trace      bilateral effusions. The interval change in the patient's      respiratory status may have been secondary to over hydration.      Lasix has been initiated. The patient has also commenced to      receiving nebulized breathing treatments.  5. Hypoglycemia. The patient has a history of diabetes mellitus and      has been receiving Lantus insulin. On October 3, the patient had a      hypoglycemic event whereby his CBGs were noted to be 56. He      received an amp of IV D50, and his Lantus insulin dose has been      decreased. Since that time, the patient's sugars have began to      increase and currently appear to be above normal. The patient's      Lantus insulin will need to be titrated up.  6. Necrotic right toe. The patient was evaluated  initially by      orthopedic surgery while in the emergency department. It appears      that the patient has been followed by his primary care doctor as      well as CVTS regarding this necrotic/gangrenous-appearing right      great toe. Orthopedic surgery has indicated that at this particular      time no surgical intervention is needed. Ortho did say that if any      further workup was needed, vascular surgery should be consulted as      it appears that  Dr. Darrick Byrd had seen the patient previously as an      outpatient.  Vascular surgery has not been consulted during this      hospitalization.  7. Failure to thrive. The patient essentially remains confined to his      bed. It has taken significant encouragement to get the patient to      consume his meals. He rarely, if ever, has any complaints. It      appears as if his lethargy has resolved, and it is not certain how      much further he will improve with regards to his failure to thrive.  8. Slight bump in the patient's cardiac markers. The patient's      troponin I and CK-MB at the time of admission were noted to be a      troponin of 0.21, 0.17, and 0.13, respectively. Likewise, CK-MBs      were found to be 8.3, 6.3, and 5.2. His EKG revealed normal sinus      rhythm with a left bundle branch block which appears to have been      old; on review of the patient's prior EKG from March 22, 2007,      he had a left bundle branch block at that particular time, also.      The patient has not complained of any chest pain over the course of      his hospitalization. Over the past two days, he has developed some      shortness  of breath, but again, I believe this may be secondary to      over zealous correction of his dehydration. Conservative management      has been pursued, an official cardiology consult has not taken      place. Given the patient's significant comorbidities plus his      failure to thrive, I doubt that any aggressive  cardiac workup would      be pursued on this patient. In any event, an official cardiac      consultation has not been pursued.      Michaelyn Barter, M.D.  Electronically Signed     OR/MEDQ  D:  04/24/2007  T:  04/24/2007  Job:  [id number]   cc:   Maxwell Caul, M.D.

## 2010-12-03 NOTE — H&P (Signed)
[name], [name]                       ACCOUNT NO.:  [id number]   MEDICAL RECORD NO.:  [id number]                   PATIENT TYPE:  INP   LOCATION:  2923                                 FACILITY:  [facility]   PHYSICIAN:  Cassell Clement, M.D.              DATE OF BIRTH:  02/28/1918   DATE OF ADMISSION:  08/30/2002  DATE OF DISCHARGE:                                HISTORY & PHYSICAL   CHIEF COMPLAINT:  Chest pain.   HISTORY OF PRESENT ILLNESS:  This is an 77 year old, married, Caucasian  gentleman with a history of known severe coronary artery disease.  He  presented himself to the office this morning with active chest pain.  He  stated that the pain had begun last night and was associated with pain down  both arms and with a sense of nausea, but no diaphoresis.  He tried  nitroglycerin without any improvement.  Of note is the fact that the patient  has also noted that he has had black, tarry stools over the past three days.  He has not been experiencing any abdominal pain.  His cardiac history  reveals that he had a history of coronary artery bypass graft surgery in  1994 and he had stenting of a vein graft to the obtuse marginal in 1999.  He  presented in August of 2000 with refractory angina and underwent cardiac  catheterization by Peter M. [place], M.D., on March 04, 1999.  At the time  of catheterization, he was found to have severe coronary atherosclerotic  disease of his native coronaries.  His left anterior descending was occluded  after the first septal perforator.  The left circumflex showed diffuse  disease in the AV groove up to 70% in the mid circumflex and the right  coronary artery was occluded proximally.  There were left to right  collaterals to the distal right coronary artery from the left coronary  artery.  The saphenous vein graft to the first diagonal was occluded  proximally.  The saphenous vein graft to the right coronary artery was  occluded proximally  and the saphenous vein graft to the obtuse marginal was  occluded proximally.  The left internal mammary artery graft to the LAD was  widely patent with excellent distal flow and there were faint collaterals to  the left circumflex system and the right coronary artery from the LAD.  The  left ventriculogram showed an ejection fraction of 45% with severe  inferobasal hypokinesis, mild anterolateral hypokinesia, and no mitral  regurgitation or prolapse.  At the time of catheterization, it was found  that he had poor target vessels for any redo coronary artery bypass surgery  and was at high risk for any intervention and optimum medical therapy  appeared to be the best choice.  The patient has done reasonably well for  the past three years until this present episode.  He has been limited in how  much he can walk because of arthritis, as well as shortness of breath.   PRESENT MEDICATIONS:  1. Glucophage 2000 mg daily.  2. Lopressor 50 mg half of a tablet daily.  3. Glyburide 5 mg three daily.  4. Adalat 30 mg daily.  5. Aspirin 325 mg daily.  6. Nitrostat p.r.n.  7. Imdur 60 mg daily.  8. Altace 10 mg daily.  9. Zocor 20 mg daily.  10.      Plavix 75 mg daily.  11.      Vitamin E once a day.  12.      MiraLax 17 g daily.   FAMILY HISTORY:  Strongly positive for coronary disease.  His father died of  an MI and a brother died of an MI.  He also had a son who died at age 50 of  an MI.   SOCIAL HISTORY:  He does not smoke or drink.  He has three children by a  previous marriage.  He has remarried and is presently living [place]   PAST MEDICAL HISTORY:  The patient has a longstanding history of diabetes.  He has a history of hypercholesterolemia and hypertension.   REVIEW OF SYSTEMS:  He has not been experiencing any recent dyspepsia or any  symptoms of peptic ulcer.  He does have a history of chronic constipation  and does use MiraLax on a p.r.n.  basis.   PHYSICAL EXAMINATION:  WEIGHT:  Not recorded in the office.  VITAL SIGNS:  His blood pressure initially was 140/80, pulse 117, and  respirations increased.  GENERAL APPEARANCE:  The patient was slightly pale.  The patient was  complaining of precordial chest discomfort.  SKIN:  Warm and dry.  HEENT:  Conjunctivae pale.  Mouth and pharynx normal.  Pupils equal and  reactive.  Sclerae clear.  CHEST:  Bilateral rales.  HEART:  Loud S3 gallop and a grade 2/6 murmur of mitral regurgitation.  ABDOMEN:  Soft and nontender.  No mass.  RECTAL:  Deferred.  EXTREMITIES:  No edema or phlebitis.  No definite pedal pulses are felt.   LABORATORY DATA:  His electrocardiogram in the office showed evidence of a  widened QRS of the left bundle branch block type and he was in sinus  tachycardia with deep ST depression laterally.   Laboratory data subsequent to his admission showed a hemoglobin down to 8.9,  hematocrit 26, BUN 24, troponin 1.41, CK total 348 with MB pending, and beta  natruretic peptide 450.  His chest x-ray showed cardiomyopathy and new  congestive heart failure.   IMPRESSION:  1. Coronary artery disease with recent subendocardial myocardial infarction     and ongoing chest pain.  2. Ischemic heart disease, status post coronary artery bypass graft in 1994     with last catheterization on March 04, 1999, showing occlusion of three     saphenous vein grafts and patency of his left internal mammary artery     graft to the left anterior descending which supplies collaterals to his     occluded right system and his occluded circumflex system.  3. Congestive heart failure exacerbated by anemia.  4. History of recent melena, rule out peptic ulcer, rule out right colon     lesion, rule out diverticular bleed.  5. Anemia.  6. Diabetes mellitus.  7. Hypercholesterolemia. 8. Chronic anticoagulation with Plavix and aspirin.   DISPOSITION:  He is being admitted to the [facility]  Coronary Care Unit.  He will be given IV nitroglycerin and oxygen.  We will  try to slow his heart rate with beta blockade.  We are going to transfuse  with packed cells.  Will give IV Lasix for CHF.  Diabetes will be managed  with sliding scale insulin and we are  stopping Glucophage and his oral agents at this time.  We are stopping  aspirin and Plavix in the face of probable active GI bleed.  Will get a 2-D  echocardiogram to evaluate LV function.  Will request a GI consult to follow  along with [place].                                               Cassell Clement, M.D.    TB/MEDQ  D:  08/30/2002  T:  08/30/2002  Job:  [id number]

## 2010-12-03 NOTE — Consult Note (Signed)
[name], [name]                       ACCOUNT NO.:  [id number]   MEDICAL RECORD NO.:  [id number]                   PATIENT TYPE:  INP   LOCATION:  2923                                 FACILITY:  [facility]   PHYSICIAN:  Fayrene Fearing L. Malon Kindle., M.D.          DATE OF BIRTH:  [birth year]   DATE OF CONSULTATION:  08/30/2002  DATE OF DISCHARGE:                                   CONSULTATION   HISTORY:  This 77 year old white male who has had known severe coronary  disease presented with active chest pain at Dr. Yevonne Pax office, the pain  began the previous night.  He was admitted.  He had a history of dark stools  over the past three days but did not really have any abdominal pain or  indigestion and had not been taking anything for ulcers.  He denies to me  any known history of ulcers recently but said that he did have ulcers 50  years ago.  He is admitted to the CCU.  Hemoglobin was found to be 8.9 and  patient has ruled in with positive enzymes for an acute MI.  He has not had  any active bleeding in the hospital.   MEDICATIONS ON ADMISSION:  Glucophage 2000 mg daily, Lopressor 50 one-half  tablet daily, Glyburide 5 mg t.i.d., Adalat 30 mg daily, aspirin 325 daily,  Nitrostat daily, Imdur 60 daily, Altace 10 daily, Zocor 20 daily, Plavix 75  daily, vitamin E and MiraLax.   PAST MEDICAL HISTORY:  The patient has a history of non insulin dependent  diabetes, high cholesterol, and high blood pressure.  He does have known  coronary disease and has had previous CABG in '94.  He has not had any other  surgeries.   FAMILY HISTORY:  Multiple family members died from coronary disease.  His  mother had some kind of cancer but he is unable to tell me what kind, he  does not know, but does not think it was colon cancer.   REVIEW OF SYSTEMS:  Primarily as mentioned in the present illness.   PHYSICAL EXAMINATION:  VITAL SIGNS: The patient is afebrile, vital signs  within normal limits.  GENERAL: This is an obese white male in no acute distress.  He is somewhat  groggy from sedation.  Denies any pain actively.  HEENT: Eyes - sclerae anicteric.  Throat - mucous membranes moist.  LUNGS: Clear.  HEART: Regular rate and rhythm without murmurs or gallops.  ABDOMEN: Soft, nontender.   ASSESSMENT:  1. Anemia with history of recent gastrointestinal bleed.  The source of his     gastrointestinal blood loss could be from the aspirin and Plavix.  Has     not an ulcer in 50 years so I am not really sure what to make of that.  I     think clearly treating him for an ulcer would be appropriate.  2. Subendocardial myocardial infarction.  3. Severe  coronary artery disease status post coronary artery bypass     grafting in the past.    PLAN:  Will go ahead and treat his myocardial infarction as you are.  Will  give empiric doubling of his proton pump inhibitor.  Endoscopy only with  active bleeding at this time.                                                   James L. Malon Kindle., M.D.    Waldron Session  D:  08/30/2002  T:  08/31/2002  Job:  [id number]   cc:   Cassell Clement, M.D.  [address]  [place]  [place] [address]  Fax: [phone]

## 2010-12-03 NOTE — H&P (Signed)
[name], [name] NO.:  [id number]   MEDICAL RECORD NO.:  [id number]          PATIENT TYPE:  EMS   LOCATION:  MAJO                         FACILITY:  [facility]   PHYSICIAN:  Colleen Can. Deborah Chalk, M.D.DATE OF BIRTH:  08-Oct-1917   DATE OF ADMISSION:  01/24/2006  DATE OF DISCHARGE:                                HISTORY & PHYSICAL   CHIEF COMPLAINT:  Altered level of consciousness.   HISTORY OF PRESENT ILLNESS:  The patient is an 77 year old white male who  has multiple medical problems.  He is brought in by his family with altered  level of consciousness.  He was found still in bed this morning at around 9  o'clock a.m.  He was only speaking a few words.  He was found with an  insulin syringe in the bed.  His other medicines apparently have not been  given, but the patient medicates himself.  He was brought to the emergency  room for concerns of pneumonia.  He was found to have a temperature of 103.  His cardiac enzymes, however, are elevated and CT scan shows a cerebral  hemorrhage in the left basilar ganglia area.  He is now admitted for further  evaluation.   PAST MEDICAL HISTORY:  1.  Ischemic heart disease.  He had previous coronary artery bypass grafting      per Sheliah Plane, M.D. in 1994.  He had stenting of his saphenous      vein graft to the OM in 1999 and his last catheterization in 2000 showed      all of his vein grafts to be occluded.  He had a patent left internal      mammary artery with severe native coronary artery disease.  He had no      target vessels felt to be appropriate for redo surgery and he is being      managed medically since that time.  2.  History of cholecystectomy in 1974.  3.  History of prostate cancer.  4.  Insulin-dependent diabetes mellitus.  5.  Hypertension.  6.  Blindness.  7.  Congestive heart failure with left ventricular dysfunction, his ejection      fraction is 35-40% per echo in April of 2005.  8.   Neuropathy.  9.  Obesity.  10. Past history of gastrointestinal bleed.  11. History of appendectomy.  12. History of hemorrhoidectomy.  13. Noncompliance.  14. Reports a stroke with right-sided deficits.   ALLERGIES:  PENICILLIN and SULFA.   CURRENT MEDICATIONS:  1.  Baby aspirin daily.  2.  Multivitamin daily.  3.  Iron tablet daily.  4.  Zocor 40 daily.  5.  IMDUR 60 mg daily.  6.  Folic acid 1 mg a day.  7.  Protonix 40 mg a day.  8.  Coreg 6.25 mg b.i.d.  9.  Lasix 80 mg 1-1/2 tablets b.i.d.  10. K-Dur 20 mEq q.i.d.  11. Novolin 70/30 35 units in the morning and 25 units in the p.m.  12. Nitroglycerin p.r.n.  13. MiraLax p.r.n.  14. Tylenol p.r.n.  15. Lopressor 50 mg  1/2 tablet b.i.d.  16. Glyburide p.r.n.  17. Plavix 75 mg a day.  18. Lisinopril 10 mg b.i.d.   FAMILY HISTORY:  Father died at 52 with heart disease, mother died at age 69  with a stroke, cancer, and had cerebral hemorrhage.   SOCIAL HISTORY:  He lives with his family and has had no alcohol or tobacco  products.  He does take his own medicines, but in the past has been  noncompliant.   REVIEW OF SYSTEMS:  Difficult to obtain.  He was found in the floor this  past Saturday, but subsequently responded and was able to get up and walk  around.  He has had no reports of chest pain or shortness of breath.   PHYSICAL EXAMINATION:  GENERAL:  He is able to only follow simple commands.  He is lethargic.  He is not really talkative.  VITAL SIGNS:  Blood pressure 119/63, heart rate is up to 119, O2 saturations  97%.  He has had fever up to 103.  SKIN:  Warm and dry.  Color is pale.  LUNGS:  Decreased breath sounds.  HEART:  Tachycardic with a regular rhythm.  ABDOMEN:  Obese with positive bowel sounds.  EXTREMITIES:  Extremely scaly and dry.  Pulses are greater on the right than  on the left.  He has no peripheral edema.  NEUROLOGY:  He is able to follow only simple commands.  His grips are equal.  He does  not smile on command.   LABORATORY DATA:  His first cardiac marker shows an MB of 11.9, troponin is  0.96.  CBC is normal except his neutrophil count is elevated on  differential.  Potassium 3.5, BUN 21, creatinine 1.7, glucose 214.  Urinalysis is abnormal with a culture pending.   IMPRESSION:  1.  Altered level of consciousness with fevers and cerebral bleed on CT      scan.  2.  Known ischemic heart disease with previous coronary artery bypass      grafting, able to be with medical management.  He is not felt to be a      candidate for redo surgery or further intervention and he now presents      with elevation in cardiac enzymes.  3.  History of congestive heart failure with known left ventricular      dysfunction.  4.  Hypertension.  5.  Diabetes.  6.  Past history of gastrointestinal bleed.  7.  Past history of strokes.   PLAN:  He is admitted to the service of Cassell Clement, M.D.  Labs will be  checked accordingly.  Aspirin and Plavix will be held.  Urology is here to  see the patient at this point in time and supportive care will be  implemented.  He will also be placed on antibiotics accordingly with  cultures and progress.      Sharlee Blew, N.P.      Colleen Can. Deborah Chalk, M.D.  Electronically Signed    LC/MEDQ  D:  01/24/2006  T:  01/24/2006  Job:  [id number]   cc:   Cassell Clement, M.D.  Fax: [phone]   Marolyn Hammock. Thad Ranger, M.D.  Fax: [phone]

## 2010-12-03 NOTE — Discharge Summary (Signed)
NAME:  [name], [name]                     ACCOUNT NO.:  [id number]   MEDICAL RECORD NO.:  [id number]                   PATIENT TYPE:  INP   LOCATION:  4704                                 FACILITY:  [facility]   PHYSICIAN:  Cassell Clement, M.D.              DATE OF BIRTH:  June 20, 1918   DATE OF ADMISSION:  08/30/2002  DATE OF DISCHARGE:  09/05/2002                                 DISCHARGE SUMMARY   FINAL DIAGNOSES:  1. Acute subendocardial myocardial infarction.  2. Congestive heart failure.  3. Gastrointestinal bleeding with melena and severe anemia.  4. Diabetes mellitus.  5. Known ischemic heart disease, status post coronary artery bypass graft.     Surgery, 1994.  6. Hypercholesterolemia.   OPERATIONS PERFORMED:  None.   HISTORY:  This 77 year old gentleman was admitted from my office where he  had presented on the morning of admission with active chest pain.  The pain  began the night before, was associated with discomfort down both arms and  with nausea, but no vomiting or diaphoresis.  He tried nitroglycerin with no  improvement.  He has been very short of breath.  He has had black tarry  stools for the past three days.   PAST MEDICAL HISTORY:  History of coronary bypass graft surgery in 1994.  In  1999, he had stenting of the vein graft to the obtuse marginal.  He had  refractory angina in 8/00 and underwent cardiac catheterization and was  found to have significant disease in his grafts as well as native vessels,  but at the time of the catheterization, it was found that he had very poor  target vessels for any type of redo coronary artery bypass surgery and he  was a high risk also for any percutaneous intervention and therefore,  optimal medical therapy appeared to be the best choice.  For the past  several years, the patient had done reasonably well until the present  episode.  He was relatively sedentary because of arthritis.   PHYSICAL EXAMINATION:  VITAL  SIGNS:  On admission, showed a blood pressure  140/80, pulse 117, respirations are increased.  GENERAL:  He was pale.  He was complaining of precordial chest discomfort.  SKIN:  Warm and dry.  HEENT:  Conjunctivae were pale.  Pupils were equal, sclerae clear.  CHEST:  Bilateral rales.  HEART:  A loud S2 gallop and there is a grade 2/6 murmur with mitral  regurgitation.  ABDOMEN:  Nontender.  EXTREMITIES:  No edema or phlebitis.  There were no pedal pulses felt.   LABORATORY DATA/STUDIES:  Electrocardiogram showed a widening QRS of a left  bundle branch type, sinus tachycardia and deep ST segment depression  laterally.  Laboratory data after admission showed a hemoglobin of 8.9,  hematocrit 26, BUN 24, troponin 1.41.  CK total of 348 and a B nitrative  peptide of 450.  Chest x-ray showed cardiomyopathy and cardiomegaly with  CHF.   HOSPITAL COURSE:  The patient was admitted to the coronary care unit and was  given IV nitroglycerin and was given oxygen.  He was given beta blockers to  slow his ventricular response.  He was transfused with 2 units of packed  cells.  The next day, he was treated with Lasix.  Diabetes was managed  initially with a sliding scale and we stopped his Glucophage and Glyburide.  We also stopped aspirin and Plavix in the face of probable active GI bleed  and we avoided using heparin and Lovenox for the same reason.  A 2-D echo  was requested to evaluate LV function and GI consultation was obtained with  Dr. Evlyn Clines who followed him along with [place].  Dr. Randa Evens  recommended empiric proton pump inhibitor twice a day.  He noted that he was  not a candidate for emergent endoscopy because of his precarious cardiac  state, but should be evaluated after he had been stabilized, possibly six  weeks after his heart attack.  The patient's serial enzymes did confirm a  large myocardial infarction, CK-MB went from 52 to 105 to 78, troponin I  went from 1.4 to 34.9 to  43.3.  His 2-D echo showed severe left ventricular  dysfunction and moderate mitral regurgitation.  His overall ejection  fraction was estimated between 25 and 35 percent with severe diffuse left  ventricular hypokinesis and akinesis of the posterior wall.  He had moderate  increased thickness of the aortic valve with mild aortic insufficiency and  moderate mitral regurgitation and mildly diverted left atrium and normal  right heart pressures.  The patient showed slow gradual improvement.  Because of his decreased left ventricular ejection fraction, Coreg was added  to his regimen.  We also added Lovenox while he was in the hospital after  the fear that he was no longer actively bleeding.  Once transfused, his  hemoglobin remained in the range of 10.4.  It is noted that his blood sugars  were running high in the hospital and diabetes treatment teaching nurse was  asked to see the patient.  Because of his heart failure, we did not restart  Glucophage.  He was instructed in how to give himself insulin.  We did  document that he had an iron deficiency anemia with low serum iron and iron  was added in the form of ferrous sulfate, once daily.  His activity was  gradually increased.  He was found to have an elevated homocystine level at  15 and folic acid is being added to his regimen.  He was transferred to 4700  where he was seen by cardiac rehabilitation nurse.  He entered cardiac rehab  and did well.  By 2/19, the patient was stable enough to be discharged home.  Hemoglobin at the time of discharge was 10.7.  On discharge, sodium 142,  potassium 4.1, BUN 23, creatinine 1.4, blood sugar 197.  Hemoglobin A1C on  2/16 was elevated at 7.5.  Lipid studies showed cholesterol 139,  triglycerides 76, HDL 35, LDL 89.  Serum iron was 26, TIBC 263, percent  saturation 10%, ferritin was 35.  Urinalysis was unremarkable.  Blood type  is AB negative.   DISCHARGE CONDITION:  The patient is being discharged  improved.  DISCHARGE MEDICATIONS:  1. Altace 10 mg 1 daily.  2. Protonix 40 mg twice a day.  3. K-Dur 20 mEq four times a day.  4. Coated aspirin 81 mg daily.  5. Lopressor  50 mg half tablet twice a day.  6. Coreg 6.25 twice a day.  7. Multivitamin once a day.  8. Ferrous sulfate 225 mg daily.  9. Lasix 80 mg twice a day.  10.      Zocor 40 mg daily.  11.      Novolin 70/30, 30 units subcu daily.  12.      Nitrostat 1/150 p.r.n.  13.      Murelax 17 grams daily.  14.      Imdur 60 mg daily.  15.      Tylenol p.r.n.  16.      Folic acid 1 mg a day.   DIET:  He will be on an 1800 calorie ADA diet.    FOLLOWUP:  He will be seen by home health.  He will see Cassell Clement,  M.D. in two weeks.  He is to call for an appointment for office visit,  Biomet, CBC and EKG and to see Fayrene Fearing L. Edwards, M.D. in six weeks to  arrange for elective endoscopy.   DISCHARGE CONDITION:  Improved.                                                 Cassell Clement, M.D.    TB/MEDQ  D:  09/05/2002  T:  09/05/2002  Job:  [id number]   cc:   Fayrene Fearing L. Malon Kindle., M.D.  [address]  [place]  [place] [address]  Fax: [phone]

## 2010-12-03 NOTE — Consult Note (Signed)
[name], [name]             ACCOUNT NO.:  [id number]   MEDICAL RECORD NO.:  [id number]          PATIENT TYPE:  INP   LOCATION:  1826                         FACILITY:  [facility]   PHYSICIAN:  Michael L. Reynolds, M.D.DATE OF BIRTH:  May 13, 1918   DATE OF CONSULTATION:  DATE OF DISCHARGE:                                   CONSULTATION   DATE OF EVALUATION:  January 24, 2006.   REQUESTING PHYSICIAN:  [facility].   REASON FOR EVALUATION:  Intracerebral hemorrhage and altered level of  consciousness.   HISTORY OF PRESENT ILLNESS:  This is the initial inpatient consultation  evaluation of this 77 year old man with a past medical history which  includes coronary artery disease, hypertension, diabetes and several other  medical problems.  The patient was brought to the emergency room today by  family members who found him this morning with an altered level of  consciousness, when he was checked on by family members, in bed this  morning.  He was only speaking a few words and seemed to be somewhat poorly  responsive.  He was noted in the field to be febrile and tachypneic.  The  family had reported that he had been falling for the last several days.  Upon arrival to the emergency room, he was noted to be febrile, generally  weak and speaking poorly.  Workup included a CT of the head which  demonstrated an acute left putaminal intracerebral hemorrhage without  significant mass effect and no hydrocephalus or intraventricular extension.  He is being admitted to the service of his primary physician, a  cardiologist.  He has had a neurologic consultation and subsequently we were  requested for management of the patient.  Presently, he is alert, but is not  really able to voice complaints.   PAST MEDICAL HISTORY:  1. Ischemic heart disease with bypass grafting in 1994, stenting in 1999.      He is not felt to be a candidate for coronary intervention at this      point.  2. Hypertension.  3. Diabetes.  4. Mild ischemic cardiomyopathy.  5. History of congestive heart failure.  6. Diabetic neuropathy.  7. History of GI bleeding.  8. History of prostate cancer.  9. He reportedly has had some prior strokes which affected his right side      in the past.   FAMILY/SOCIAL/REVIEW OF SYSTEMS HISTORY:  As outlined in the admission H&P  by Conni Elliot, N.P., and Dr. Deborah Chalk, which is reviewed.   MEDICATIONS:  At the time of admission he was taking aspirin, Plavix, oral  nitroglycerin, Protonix, Lopressor, Lasix and insulin.   PHYSICAL EXAMINATION:  VITAL SIGNS:  Temperature 103.2, blood pressure  119/63, heart rate 119, respirations 20, O2 sat 97% on 2 L O2 nasal cannula.  GENERAL EXAMINATION:  This is an ill-appearing man supine in the hospital  bed in mild distress.  HEAD:  Cranium is normocephalic and atraumatic.  HEENT:  Oropharynx is benign.  NECK:  Supple without carotid or supraclavicular bruits.  HEART:  Tachycardic regular rhythm.  No murmurs.  EXTREMITIES:  Some bruising is noted on the knees.  He has decreased  peripheral pulses.  NEUROLOGIC EXAMINATION:  MENTAL STATUS:  He appears awake and fairly alert.  He is not able to answer  orientation questions, is able to imitate a few simple sounds such as la-la-  la, but other than that makes no efforts of speech.  He does follow one- to  two-simple commands, gets a little confused with more complex commands.  CRANIAL NERVES:  Pupils equal and reactive.  Extraocular movements appear to  be full.  He does not really blink to threat from either side.  He does seem  to have a symmetric face.  Tongue protrudes symmetrically.  His palate  elevates, although somewhat poorly.  MOTOR:  Normal bulk and tone.  He seems to have a little bit of right-sided  weakness and difficulty maintaining the right arm against gravity.  SENSORY:  He withdraws all extremities to pain, a little less with the right  upper  extremity.  REFLEXES:  Generally diminished throughout.  Toes are downgoing bilaterally.  CEREBELLAR:  Deferred.  GAIT:  Deferred.   LABORATORY REVIEW:  CBC:  White count 9, hemoglobin 14, platelets 167,000.  Complete metabolic panel is remarkable for an elevated glucose of 214.  BUN  and creatinine 21 and 1.7 respectively, a slightly elevated AST.  Urinalysis  demonstrates 7 to 10 white cells, numerous red cells, positive leukocyte  esterase.  Cardiac enzymes are positive with elevated CK-MB and troponins.  A CT of the head is personally reviewed, and this study demonstrates a  rather small hemorrhage in the left putamen which is not surrounded by  significant edema, or exerting significant mass effect.  There is no  hydrocephalus or intracranial extension.   IMPRESSION:  1. Acute left putaminal intracerebral hemorrhage, relatively small.  He      likely has a right hemiparesis and some aphasia secondary to this,      although he is a little difficult to examine in his encephalopathic      state.  2. Encephalopathy likely secondary to urinary tract infection with      septicemia.  His hemorrhage may be playing a small role in this, but it      is unlikely to be a large one.  3. Acute myocardial infarction by enzymes.  4. Numerous other medical problems as outlined above.   RECOMMENDATIONS:  Obviously all anticoagulant and antiplatelet drugs should  be held.  His blood pressure should be maintained in a near-normal range  with systolics less than 160.  We will check a followup CT in the morning.  Once he is definitely stable, he will need a stroke workup, as well as PT/OT  and rehab evaluations.  We will keep him NPO pending a swallow evaluation.  He has already had his antibiotics started and hopefully as he gets the  antibiotics his mental status will improve.  Stroke service to follow.  Thank you for the consultation.     Michael L. Thad Ranger, M.D.  Electronically  Signed     MLR/MEDQ  D:  01/24/2006  T:  01/25/2006  Job:  [id number]   cc:   Cassell Clement, M.D.

## 2010-12-03 NOTE — Discharge Summary (Signed)
[name], USELMAN NO.:  [id number]   MEDICAL RECORD NO.:  [id number]          PATIENT TYPE:  INP   LOCATION:  3705                         FACILITY:  [facility]   PHYSICIAN:  Cassell Clement, M.D. DATE OF BIRTH:  Feb 03, 1918   DATE OF ADMISSION:  01/24/2006  DATE OF DISCHARGE:  02/02/2006                                 DISCHARGE SUMMARY   FINAL DIAGNOSES:  1.  Intracerebral hemorrhage and altered level of consciousness.  2.  Acute myocardial infarction.  3.  Ischemic heart disease with bypass graft surgery in 1994, and stenting      in 1999, with poor target vessels and not felt to be a candidate for any      further cardiac intervention.  4.  Hypertensive cardiovascular disease.  5.  Insulin-dependent diabetes.  6.  Ischemic cardiomyopathy.  7.  Congestive heart failure.  8.  Diabetic neuropathy.  9.  Past history of gastrointestinal bleeding.  10. Past history of prostate cancer.  11. Prior stroke.   OPERATIONS PERFORMED:  None.   HISTORY:  This 77 year old gentleman was brought to the [facility] Emergency  Department because of altered level of consciousness.  His family members  found him this morning in bed and was able to speak only a few words and was  poorly responsive.  He was also noted to be febrile and tachypneic.  Family  reported that he had been falling for the last several days and on arrival  in the emergency room, he was noted to be febrile, weak, and speaking  poorly.  He had a CT scan of the head which demonstrated an acute left  putaminal intracerebral hemorrhage without significant mass effect.  He also  had cardiac enzymes showing evidence of recent acute myocardial infarction.  His MB was 11.9 and troponin was 0.96 on admission.   PHYSICAL EXAMINATION:  GENERAL:  On admission, he was able to follow only  simple commands.  He was lethargic.  VITAL SIGNS:  Blood pressure was 119/63; pulse was rapid at 119, in sinus  tachycardia.  O2  saturation was 97%.  Temperature was 103.  SKIN:  Warm and dry.  Color was pale.  LUNGS:  Revealed diminished breath sounds.  HEART:  Tachypneic with a regular rhythm and no gallop.  ABDOMEN:  Obese with positive bowel sounds.  EXTREMITIES:  Dry and scaly.  There was no peripheral edema.  He had equal  grips.  He does not smile on command.   HOSPITAL COURSE:  The patient was admitted to telemetry to Dr. Yevonne Pax  service.  He was seen on the afternoon of admission in consultation by Dr.  Kelli Hope of neurology service.  He recommended that all  anticoagulant and antiplatelet drugs should be held for at least four weeks.  He felt that his blood pressure should be maintained in a near-normal range  with systolics less than 160.  He recommended a stroke workup, as well as PT  and OT and rehab evaluation.  He was kept n.p.o. until a swallowing  evaluation could be performed.  Because of a temperature of  103 and pyuria,  antibiotics were begun in the emergency room.  On admission, his  electrolytes were satisfactory with a blood sugar of 214, potassium 3.5,  sodium 141, BUN 21, and creatinine 1.7.  White count on admission was 9000  despite fever of 103.  By July 11, he was still in the emergency room  awaiting a bed assignment.  His neurologic condition had improved, however,  and his level of consciousness had returned to normal level.  Serial cardiac  enzymes showed a definite non-Q-wave myocardial infarction.  He was  initially begun on a regular diet with honey-thick liquids.  The patient did  not have any chest pain or dyspnea despite his elevated cardiac enzymes.  His activity was gradually increased.  It was noted that at home he was very  sedentary, although he was able to walk from one room to the next.  When he  comes to our office, he is always brought in a wheelchair.  His blood sugars  remained elevated and, at times, were over 400 and he was placed on sliding  scale,  as well as his Lantus insulin was increased.  Speech pathology  evaluated him with a modified barium swallow which felt that he could be on  a regular diet and he could return to thin liquids using a chin tuck with  each swallow.  The patient tolerated diet and activity okay.  From the  standpoint of his diabetes, he was noted to have a hemoglobin A1c of 9.8,  blood sugar in the 300s, and Lantus was increased in a stepwise fashion and  at discharge was up to 44 units.  The patient received physical therapy and  occupational therapy.  He remained weak and required assistance with  ambulation and the family and nursing staff and physical therapist felt that  he would benefit from short-term skilled nursing facility before returning  home.  The patient was somewhat reluctant about this but was persuaded that  this was in his best interest and a search for skilled nursing bed was made.  A bed was found at [facility].  The patient will be discharged to [facility] on  February 02, 2006.   ACCESSORY LABORATORY STUDIES:  At the time of discharge, his hemoglobin is  14.1, white count 10,400, platelet count 289,000.  Sodium 135, potassium  3.7, BUN 24, creatinine 1.4, blood sugar 276.  Liver function studies are  normal with albumin of 3 which is improving in the hospital.  On January 26, 2006, two days after admission, CK was 701 with a CK-MB of 21.4.  B  natriuretic peptic on July 14, was elevated at 257.  As noted, his  hemoglobin A1c was 9.8.  A CT scan without contrast done one day prior to  discharge showed no change in the pattern of the small stable intracerebral  hemorrhage.   DIET:  The patient will be discharged on a diabetic, low-cholesterol diet.   ACTIVITY:  Walk with assistance.  He is to get physical therapy and  occupational therapy.   DISCHARGE MEDICATIONS:  1.  Protonix 40 mg daily.  2.  Imdur 60 mg daily.  3.  Lasix 80 mg daily. 4.  Zocor 40 mg daily.  5.  Lopressor 50 mg twice a  day.  6.  Altace 10 mg daily.  7.  Lantus 44 units daily.  8.  Tylenol 325 two every six hours p.r.n.  9.  Nitrostat 1/150 sublingually p.r.n.   He may  take his own eye drops.   He is to return to Dr. Patty Sermons p.r.n.  He is not to receive any  anticoagulants until August 10, which will be one month after admission.  At  that point, he should be started on an aspirin and if he has any recurrence  of chest pain,  Plavix could also be considered.  In the hospital, the patient was a limited  code blue with no CPR.  The patient will be followed by the staff physician  at [facility].   CONDITION ON DISCHARGE:  Improved.           ______________________________  Cassell Clement, M.D.     TB/MEDQ  D:  02/02/2006  T:  02/02/2006  Job:  [id number]   cc:   Casimiro Needle L. Thad Ranger, M.D.  Fax: [phone]   [facility] [place]

## 2011-04-28 LAB — BASIC METABOLIC PANEL
BUN: 14
BUN: 17
BUN: 23
BUN: 26 — ABNORMAL HIGH
BUN: 28 — ABNORMAL HIGH
BUN: 7
BUN: 70 — ABNORMAL HIGH
CO2: 22
CO2: 27
CO2: 29
CO2: 29
CO2: 30
CO2: 31
CO2: 33 — ABNORMAL HIGH
Calcium: 8.2 — ABNORMAL LOW
Calcium: 8.6
Calcium: 8.6
Calcium: 8.7
Calcium: 8.7
Calcium: 8.9
Calcium: 9
Calcium: 9
Calcium: 9.2
Chloride: 102
Chloride: 103
Chloride: 105
Chloride: 106
Chloride: 107
Chloride: 107
Creatinine, Ser: 1.04
Creatinine, Ser: 1.2
Creatinine, Ser: 1.22
Creatinine, Ser: 1.31
Creatinine, Ser: 1.42
Creatinine, Ser: 2.9 — ABNORMAL HIGH
GFR calc Af Amer: 45 — ABNORMAL LOW
GFR calc Af Amer: >60
GFR calc Af Amer: >60
GFR calc Af Amer: >60
GFR calc non Af Amer: 52 — ABNORMAL LOW
GFR calc non Af Amer: 56 — ABNORMAL LOW
GFR calc non Af Amer: 56 — ABNORMAL LOW
GFR calc non Af Amer: 57 — ABNORMAL LOW
GFR calc non Af Amer: >60
GFR calc non Af Amer: >60
Glucose, Bld: 159 — ABNORMAL HIGH
Glucose, Bld: 162 — ABNORMAL HIGH
Glucose, Bld: 166 — ABNORMAL HIGH
Glucose, Bld: 166 — ABNORMAL HIGH
Glucose, Bld: 173 — ABNORMAL HIGH
Glucose, Bld: 174 — ABNORMAL HIGH
Glucose, Bld: 180 — ABNORMAL HIGH
Glucose, Bld: 217 — ABNORMAL HIGH
Glucose, Bld: 282 — ABNORMAL HIGH
Potassium: 3.6
Potassium: 3.9
Potassium: 4
Potassium: 4.3
Potassium: 4.5
Sodium: 136
Sodium: 136
Sodium: 138
Sodium: 140
Sodium: 142
Sodium: 142
Sodium: 143

## 2011-04-28 LAB — URINE CULTURE: Colony Count: >=100000

## 2011-04-28 LAB — URINALYSIS, ROUTINE W REFLEX MICROSCOPIC
Bilirubin Urine: NEGATIVE
Glucose, UA: NEGATIVE
Hgb urine dipstick: NEGATIVE
Protein, ur: NEGATIVE
Specific Gravity, Urine: 1.017
pH: 5

## 2011-04-28 LAB — CBC
HCT: 31.2 — ABNORMAL LOW
HCT: 31.9 — ABNORMAL LOW
HCT: 32.6 — ABNORMAL LOW
HCT: 38.4 — ABNORMAL LOW
HCT: 38.6 — ABNORMAL LOW
HCT: 40
Hemoglobin: 10.6 — ABNORMAL LOW
Hemoglobin: 10.7 — ABNORMAL LOW
Hemoglobin: 10.9 — ABNORMAL LOW
Hemoglobin: 11.1 — ABNORMAL LOW
Hemoglobin: 13
Hemoglobin: 13.1
MCHC: 33.5
MCHC: 33.9
MCHC: 33.9
MCHC: 34
MCV: 85.3
Platelets: 273
Platelets: 273
Platelets: 319
RBC: 3.69 — ABNORMAL LOW
RBC: 3.85 — ABNORMAL LOW
RBC: 4.55
RBC: 4.65
RDW: 14
RDW: 14.1 — ABNORMAL HIGH
RDW: 14.2 — ABNORMAL HIGH
RDW: 14.3 — ABNORMAL HIGH
WBC: 10
WBC: 10.3
WBC: 15.3 — ABNORMAL HIGH
WBC: 8.7

## 2011-04-28 LAB — DIFFERENTIAL
Basophils Absolute: 0.1
Eosinophils Absolute: 0.1
Monocytes Absolute: 1.3 — ABNORMAL HIGH
Neutrophils Relative %: 76

## 2011-04-28 LAB — CARDIAC PANEL(CRET KIN+CKTOT+MB+TROPI)
Relative Index: 3.5 — ABNORMAL HIGH
Relative Index: 4.3 — ABNORMAL HIGH
Total CK: 199

## 2011-04-28 LAB — CK TOTAL AND CKMB (NOT AT ARMC)
CK, MB: 8.3 — ABNORMAL HIGH
Relative Index: 3.2 — ABNORMAL HIGH

## 2011-04-28 LAB — B-NATRIURETIC PEPTIDE (CONVERTED LAB): Pro B Natriuretic peptide (BNP): 1780 — ABNORMAL HIGH

## 2011-04-29 LAB — COMPREHENSIVE METABOLIC PANEL
ALT: 17
AST: 25
CO2: 29
Calcium: 9.1
Creatinine, Ser: 1.53 — ABNORMAL HIGH
GFR calc Af Amer: 52 — ABNORMAL LOW
GFR calc non Af Amer: 43 — ABNORMAL LOW
Sodium: 140
Total Protein: 6.7

## 2011-04-29 LAB — APTT: aPTT: 33

## 2011-04-29 LAB — CBC
MCHC: 33.5
MCV: 85.8
RBC: 4.2 — ABNORMAL LOW
RDW: 13.2

## 2011-04-29 LAB — PROTIME-INR: Prothrombin Time: 13.7

## 2019-08-07 ENCOUNTER — Ambulatory Visit: Payer: Self-pay | Attending: Internal Medicine

## 2019-08-07 DIAGNOSIS — Z23 Encounter for immunization: Secondary | ICD-10-CM | POA: Insufficient documentation

## 2019-08-21 ENCOUNTER — Ambulatory Visit: Payer: Self-pay

## 2019-09-02 ENCOUNTER — Telehealth: Payer: Self-pay | Admitting: *Deleted

## 2019-09-02 NOTE — Telephone Encounter (Signed)
Attempted to contact pt to schedule appointment for 2nd COVID vaccine; message states caller not available; unable to leave message.
